# Patient Record
Sex: Female | Born: 1985 | Race: Black or African American | Hispanic: No | Marital: Married | Smoking: Never smoker
Health system: Southern US, Community
[De-identification: ages and names within clinical notes are randomized; demographics above are authoritative.]

## PROBLEM LIST (undated history)

## (undated) ENCOUNTER — Inpatient Hospital Stay (HOSPITAL_COMMUNITY): Payer: Self-pay

## (undated) DIAGNOSIS — N90810 Female genital mutilation status, unspecified: Secondary | ICD-10-CM

## (undated) DIAGNOSIS — O139 Gestational [pregnancy-induced] hypertension without significant proteinuria, unspecified trimester: Secondary | ICD-10-CM

## (undated) HISTORY — PX: CHOLECYSTECTOMY: SHX55

## (undated) HISTORY — DX: Female genital mutilation status, unspecified: N90.810

---

## 2014-01-11 NOTE — L&D Delivery Note (Signed)
Patient is 29 y.o. [id number] [redacted]w[redacted]d admitted  admitted for version which was successful. Had cervical ripening with cytotec and then was started on pitocin. AROM was performed using FSE for slow release of amniotic fluid.   Delivery Note At 3:55 PM a viable female was delivered via Vaginal, Spontaneous Delivery (Presentation: left; Occiput Anterior).  APGAR: 9, 9; weight 7 lb 10.9 oz (3485 g).  Placenta status: Intact, Spontaneous.  Cord: 3 vessels with the following complications: None.   Anesthesia: Epidural  Episiotomy: None Lacerations: 2nd degree Suture Repair: 3.0 vicryl Est. Blood Loss (mL): 130  Mom to postpartum.  Baby to Couplet care / Skin to Skin.  Durenda Hurt 10/11/2014, 6:56 PM

## 2014-02-13 ENCOUNTER — Encounter (HOSPITAL_COMMUNITY): Payer: Self-pay | Admitting: Emergency Medicine

## 2014-02-13 ENCOUNTER — Emergency Department (HOSPITAL_COMMUNITY)
Admission: EM | Admit: 2014-02-13 | Discharge: 2014-02-13 | Disposition: A | Discharge status: Home / Self Care | Payer: Medicaid Other | Source: Home / Self Care | Attending: Emergency Medicine | Admitting: Emergency Medicine

## 2014-02-13 DIAGNOSIS — R112 Nausea with vomiting, unspecified: Secondary | ICD-10-CM

## 2014-02-13 DIAGNOSIS — Z3490 Encounter for supervision of normal pregnancy, unspecified, unspecified trimester: Secondary | ICD-10-CM

## 2014-02-13 DIAGNOSIS — Z3201 Encounter for pregnancy test, result positive: Secondary | ICD-10-CM

## 2014-02-13 DIAGNOSIS — E860 Dehydration: Secondary | ICD-10-CM

## 2014-02-13 LAB — POCT I-STAT, CHEM 8
BUN: 11 mg/dL (ref 6–23)
CALCIUM ION: 1.12 mmol/L (ref 1.12–1.23)
CHLORIDE: 103 mmol/L (ref 96–112)
CREATININE: 0.6 mg/dL (ref 0.50–1.10)
Glucose, Bld: 108 mg/dL — ABNORMAL HIGH (ref 70–99)
HEMATOCRIT: 47 % — AB (ref 36.0–46.0)
Hemoglobin: 16 g/dL — ABNORMAL HIGH (ref 12.0–15.0)
Potassium: 3.8 mmol/L (ref 3.5–5.1)
SODIUM: 139 mmol/L (ref 135–145)
TCO2: 20 mmol/L (ref 0–100)

## 2014-02-13 LAB — POCT URINALYSIS DIP (DEVICE)
GLUCOSE, UA: NEGATIVE mg/dL
Hgb urine dipstick: NEGATIVE
Ketones, ur: 40 mg/dL — AB
NITRITE: NEGATIVE
PROTEIN: NEGATIVE mg/dL
Specific Gravity, Urine: 1.025 (ref 1.005–1.030)
Urobilinogen, UA: 4 mg/dL — ABNORMAL HIGH (ref 0.0–1.0)
pH: 6 (ref 5.0–8.0)

## 2014-02-13 LAB — POCT PREGNANCY, URINE: PREG TEST UR: POSITIVE — AB

## 2014-02-13 MED ORDER — ONDANSETRON HCL 4 MG PO TABS: 4.0000 mg | ORAL_TABLET | Freq: Three times a day (TID) | ORAL | Status: DC | PRN

## 2014-02-13 MED ORDER — ONDANSETRON HCL 4 MG/2ML IJ SOLN
4.0000 mg | Freq: Once | INTRAMUSCULAR | Status: AC
  Administered 2014-02-13: 4 mg via INTRAVENOUS

## 2014-02-13 MED ORDER — ONDANSETRON HCL 4 MG/2ML IJ SOLN
INTRAMUSCULAR | Status: AC
  Filled 2014-02-13: qty 2

## 2014-02-13 MED ORDER — PRENATAL VITAMINS 28-0.8 MG PO TABS: 1.0000 | ORAL_TABLET | Freq: Every day | ORAL | Status: DC

## 2014-02-13 MED ORDER — SODIUM CHLORIDE 0.9 % IV SOLN
Freq: Once | INTRAVENOUS | Status: AC
  Administered 2014-02-13: 15:00:00 via INTRAVENOUS

## 2014-02-13 NOTE — ED Provider Notes (Signed)
CSN: [id number]     Arrival date & time 02/13/14  1232 History   First MD Initiated Contact with Patient 02/13/14 1332     Chief Complaint  Patient presents with  . Emesis   (Consider location/radiation/quality/duration/timing/severity/associated sxs/prior Treatment) HPI Comments: LNMP: 12/26/2013  Patient is a 29 y.o. female presenting with vomiting. The history is provided by the patient and the spouse. The history is limited by a language barrier. No language interpreter was used.  Emesis Severity:  Moderate Duration:  3 days Quality:  Stomach contents Progression:  Unchanged Chronicity:  New Associated symptoms: no abdominal pain, no arthralgias, no chills, no cough, no diarrhea, no fever, no headaches, no myalgias, no sore throat and no URI   Risk factors: sick contacts     History reviewed. No pertinent past medical history. History reviewed. No pertinent past surgical history. No family history on file. History  Substance Use Topics  . Smoking status: Not on file  . Smokeless tobacco: Not on file  . Alcohol Use: Not on file   OB History    No data available     Review of Systems  Constitutional: Positive for appetite change. Negative for chills.  HENT: Negative.  Negative for sore throat.   Eyes: Negative.   Respiratory: Negative.   Cardiovascular: Negative.   Gastrointestinal: Positive for nausea and vomiting. Negative for abdominal pain, diarrhea, constipation and blood in stool.  Genitourinary: Negative.   Musculoskeletal: Negative for myalgias, back pain and arthralgias.  Skin: Negative.   Neurological: Positive for dizziness and light-headedness. Negative for syncope, weakness and headaches.    Allergies  Review of patient's allergies indicates no known allergies.  Home Medications   Prior to Admission medications   Medication Sig Start Date End Date Taking? Authorizing Provider  ondansetron (ZOFRAN) 4 MG tablet Take 1 tablet (4 mg total) by mouth  every 8 (eight) hours as needed for nausea or vomiting. 02/13/14   Ria ClockJennifer Lee H Presson, PA  Prenatal Vit-Fe Fumarate-FA (PRENATAL VITAMINS) 28-0.8 MG TABS Take 1 tablet by mouth daily. 02/13/14   Jess BartersJennifer Lee H Presson, PA   BP 124/90 mmHg  Pulse 98  Temp(Src) 98.4 F (36.9 C) (Oral)  Resp 16  SpO2 100%  LMP 12/26/2013 Physical Exam  Constitutional: She is oriented to person, place, and time. She appears well-developed and well-nourished. No distress.  HENT:  Head: Normocephalic and atraumatic.  Right Ear: External ear normal.  Left Ear: External ear normal.  Nose: Nose normal.  Mouth/Throat: Oropharynx is clear and moist.  Eyes: Conjunctivae are normal. No scleral icterus.  Neck: Trachea normal, normal range of motion and full passive range of motion without pain. Neck supple.  Cardiovascular: Normal rate, regular rhythm and normal heart sounds.   Pulmonary/Chest: Effort normal and breath sounds normal.  Abdominal: Soft. Normal appearance and bowel sounds are normal. She exhibits no distension and no mass. There is no tenderness. There is no CVA tenderness.  Musculoskeletal: Normal range of motion.  Lymphadenopathy:    She has no cervical adenopathy.  Neurological: She is alert and oriented to person, place, and time.  Skin: Skin is warm and dry.  Psychiatric: She has a normal mood and affect. Her behavior is normal.  Nursing note and vitals reviewed.   ED Course  Procedures (including critical care time) Labs Review Labs Reviewed  POCT PREGNANCY, URINE - Abnormal; Notable for the following:    Preg Test, Ur POSITIVE (*)    All other components within normal  limits  POCT I-STAT, CHEM 8 - Abnormal; Notable for the following:    Glucose, Bld 108 (*)    Hemoglobin 16.0 (*)    HCT 47.0 (*)    All other components within normal limits  POCT URINALYSIS DIP (DEVICE) - Abnormal; Notable for the following:    Bilirubin Urine SMALL (*)    Ketones, ur 40 (*)    Urobilinogen, UA  4.0 (*)    Leukocytes, UA TRACE (*)    All other components within normal limits    Imaging Review No results found.   MDM   1. Non-intractable vomiting with nausea, vomiting of unspecified type   2. Pregnant   3. Dehydration    Patient given 1L NS IV at [facility] with 4 mg IV zofran followed by oral fluid challenge.  UPT positive. Patient without any complaints of abdominal pain. While vomiting could certainly be attributed to first trimester pregnancy, it should be considered that patient's son is also currently ill with viral gastroenteritis. NO suspect travel or food intake.  UA consistent with dehydration. Patient ambulatory and tolerating oral fluids without continued dizziness following IV hydration. Will discharge home with Rx for prenatal vitamins, zofran, precautions and referral to [facility] for prenatal care. To see re-evaluation at ER of symptoms persist despite zofran.    Ria Clock, [place] 02/13/14 1630

## 2014-02-13 NOTE — ED Notes (Signed)
Via Husband, Arabic interpreter, pt C/o vomiting onset 3 days Sx also include: n/v, decreased appetite LMP = 12/26/2013 Arrived from [place] last month Alert, no signs of acute distress.

## 2014-02-13 NOTE — Discharge Instructions (Signed)
Dehydration, Adult °Dehydration is when you lose more fluids from the body than you take in. Vital organs like the kidneys, brain, and heart cannot function without a proper amount of fluids and salt. Any loss of fluids from the body can cause dehydration.  °CAUSES  °· Vomiting. °· Diarrhea. °· Excessive sweating. °· Excessive urine output. °· Fever. °SYMPTOMS  °Mild dehydration °· Thirst. °· Dry lips. °· Slightly dry mouth. °Moderate dehydration °· Very dry mouth. °· Sunken eyes. °· Skin does not bounce back quickly when lightly pinched and released. °· Dark urine and decreased urine production. °· Decreased tear production. °· Headache. °Severe dehydration °· Very dry mouth. °· Extreme thirst. °· Rapid, weak pulse (more than 100 beats per minute at rest). °· Cold hands and feet. °· Not able to sweat in spite of heat and temperature. °· Rapid breathing. °· Blue lips. °· Confusion and lethargy. °· Difficulty being awakened. °· Minimal urine production. °· No tears. °DIAGNOSIS  °Your caregiver will diagnose dehydration based on your symptoms and your exam. Blood and urine tests will help confirm the diagnosis. The diagnostic evaluation should also identify the cause of dehydration. °TREATMENT  °Treatment of mild or moderate dehydration can often be done at home by increasing the amount of fluids that you drink. It is best to drink small amounts of fluid more often. Drinking too much at one time can make vomiting worse. Refer to the home care instructions below. °Severe dehydration needs to be treated at the hospital where you will probably be given intravenous (IV) fluids that contain water and electrolytes. °HOME CARE INSTRUCTIONS  °· Ask your caregiver about specific rehydration instructions. °· Drink enough fluids to keep your urine clear or pale yellow. °· Drink small amounts frequently if you have nausea and vomiting. °· Eat as you normally do. °· Avoid: °¨ Foods or drinks high in sugar. °¨ Carbonated  drinks. °¨ Juice. °¨ Extremely hot or cold fluids. °¨ Drinks with caffeine. °¨ Fatty, greasy foods. °¨ Alcohol. °¨ Tobacco. °¨ Overeating. °¨ Gelatin desserts. °· Wash your hands well to avoid spreading bacteria and viruses. °· Only take over-the-counter or prescription medicines for pain, discomfort, or fever as directed by your caregiver. °· Ask your caregiver if you should continue all prescribed and over-the-counter medicines. °· Keep all follow-up appointments with your caregiver. °SEEK MEDICAL CARE IF: °· You have abdominal pain and it increases or stays in one area (localizes). °· You have a rash, stiff neck, or severe headache. °· You are irritable, sleepy, or difficult to awaken. °· You are weak, dizzy, or extremely thirsty. °SEEK IMMEDIATE MEDICAL CARE IF:  °· You are unable to keep fluids down or you get worse despite treatment. °· You have frequent episodes of vomiting or diarrhea. °· You have blood or green matter (bile) in your vomit. °· You have blood in your stool or your stool looks black and tarry. °· You have not urinated in 6 to 8 hours, or you have only urinated a small amount of very dark urine. °· You have a fever. °· You faint. °MAKE SURE YOU:  °· Understand these instructions. °· Will watch your condition. °· Will get help right away if you are not doing well or get worse. °Document Released: 12/28/2004 Document Revised: 03/22/2011 Document Reviewed: 08/17/2010 °Exit[employer]® Patient Information ©2015 [employer]. This information is not intended to replace advice given to you by your health care provider. Make sure you discuss any questions you have with your health care   provider.  Nausea and Vomiting Nausea is a sick feeling that often comes before throwing up (vomiting). Vomiting is a reflex where stomach contents come out of your mouth. Vomiting can cause severe loss of body fluids (dehydration). Children and elderly adults can become dehydrated quickly, especially if they also have  diarrhea. Nausea and vomiting are symptoms of a condition or disease. It is important to find the cause of your symptoms. CAUSES   Direct irritation of the stomach lining. This irritation can result from increased acid production (gastroesophageal reflux disease), infection, food poisoning, taking certain medicines (such as nonsteroidal anti-inflammatory drugs), alcohol use, or tobacco use.  Signals from the brain.These signals could be caused by a headache, heat exposure, an inner ear disturbance, increased pressure in the brain from injury, infection, a tumor, or a concussion, pain, emotional stimulus, or metabolic problems.  An obstruction in the gastrointestinal tract (bowel obstruction).  Illnesses such as diabetes, hepatitis, gallbladder problems, appendicitis, kidney problems, cancer, sepsis, atypical symptoms of a heart attack, or eating disorders.  Medical treatments such as chemotherapy and radiation.  Receiving medicine that makes you sleep (general anesthetic) during surgery. DIAGNOSIS Your caregiver may ask for tests to be done if the problems do not improve after a few days. Tests may also be done if symptoms are severe or if the reason for the nausea and vomiting is not clear. Tests may include:  Urine tests.  Blood tests.  Stool tests.  Cultures (to look for evidence of infection).  X-rays or other imaging studies. Test results can help your caregiver make decisions about treatment or the need for additional tests. TREATMENT You need to stay well hydrated. Drink frequently but in small amounts.You may wish to drink water, sports drinks, clear broth, or eat frozen ice pops or gelatin dessert to help stay hydrated.When you eat, eating slowly may help prevent nausea.There are also some antinausea medicines that may help prevent nausea. HOME CARE INSTRUCTIONS   Take all medicine as directed by your caregiver.  If you do not have an appetite, do not force yourself to  eat. However, you must continue to drink fluids.  If you have an appetite, eat a normal diet unless your caregiver tells you differently.  Eat a variety of complex carbohydrates (rice, wheat, potatoes, bread), lean meats, yogurt, fruits, and vegetables.  Avoid high-fat foods because they are more difficult to digest.  Drink enough water and fluids to keep your urine clear or pale yellow.  If you are dehydrated, ask your caregiver for specific rehydration instructions. Signs of dehydration may include:  Severe thirst.  Dry lips and mouth.  Dizziness.  Dark urine.  Decreasing urine frequency and amount.  Confusion.  Rapid breathing or pulse. SEEK IMMEDIATE MEDICAL CARE IF:   You have blood or brown flecks (like coffee grounds) in your vomit.  You have black or bloody stools.  You have a severe headache or stiff neck.  You are confused.  You have severe abdominal pain.  You have chest pain or trouble breathing.  You do not urinate at least once every 8 hours.  You develop cold or clammy skin.  You continue to vomit for longer than 24 to 48 hours.  You have a fever. MAKE SURE YOU:   Understand these instructions.  Will watch your condition.  Will get help right away if you are not doing well or get worse. Document Released: 12/28/2004 Document Revised: 03/22/2011 Document Reviewed: 05/27/2010 [facility] Patient Information 2015 [place], [place]. This information  is not intended to replace advice given to you by your health care provider. Make sure you discuss any questions you have with your health care provider.  Medicines During Pregnancy During pregnancy, there are medicines that are either safe or unsafe to take. Medicines include prescriptions from your caregiver, over-the-counter medicines, topical creams applied to the skin, and all herbal substances. Medicines are put into either Class A, B, C, or D. Class A and B medicines have been shown to be safe in  pregnancy. Class C medicines are also considered to be safe in pregnancy, but these medicines should only be used when necessary. Class D medicines should not be used at all in pregnancy. They can be harmful to a baby.  It is best to take as little medicine as possible while pregnant. However, some medicines are necessary to take for the mother and baby's health. Sometimes, it is more dangerous to stop taking certain medicines than to stay on them. This is often the case for people with long-term (chronic) conditions such as asthma, diabetes, or high blood pressure (hypertension). If you are pregnant and have a chronic illness, call your caregiver right away. Bring a list of your medicines and their doses to your appointments. If you are planning to become pregnant, schedule a doctor's appointment and discuss your medicines with your caregiver. Lastly, write down the phone number to your pharmacist. They can answer questions regarding a medicine's class and safety. They cannot give advice as to whether you should or should not be on a medicine.  SAFE AND UNSAFE MEDICINES There is a long list of medicines that are considered safe in pregnancy. Below is a shorter list. For specific medicines, ask your caregiver.  AllergyMedicines Loratadine, cetirizine, and chlorpheniramine are safe to take. Certain nasal steroid sprays are safe. Talk to your caregiver about specific brands that are safe. Analgesics Acetaminophen and acetaminophen with codeine are safe to take. All other nonsteroidal anti-inflammatory drugs (NSAIDS) are not safe. This includes ibuprofen.  Antacids Many over-the-counter antacids are safe to take. Talk to your caregiver about specific brands that are safe. Famotidine, ranitidine, and lansoprazole are safe. Omepresole is considered safe to take in the second trimester. Antibiotic Medicines There are several antibiotics to avoid. These include, but are not limited to, tetracyline,  quinolones, and sulfa medications. Talk to your caregiver before taking any antibiotic.  Antihistamines Talk to your caregiver about specific brands that are safe.  Asthma Medicines Most asthma steroid inhalers are safe to take. Talk to your caregiver for specific details. Calcium Calcium supplements are safe to take. Do not take oyster shell calcium.  Cough and Cold Medicines It is safe to take products with guaifenesin or dextromethorphan. Talk to your caregiver about specific brands that are safe. It is not safe to take products that contain aspirin or ibuprofen. Decongestant Medicines Pseudoephedrine-containing products are safe to take in the second and third trimester.  Depression Medicines Talk about these medicines with your caregiver.  Antidiarrheal Medicines It is safe to take loperamide. Talk to your caregiver about specific brands that are safe. It is not safe to take any antidiarrheal medicine that contains bismuth. Eyedrops Allergy eyedrops should be limited.  Iron It is safe to use certain iron-containing medicines for anemia in pregnancy. They require a prescription.  Antinausea Medicines It is safe to take doxylamine and vitamin B6 as directed. There are other prescription medicines available, if needed.  Sleep aids It is safe to take diphenhydramine and acetaminophen with diphenhydramine.  Steroids Hydrocortisone creams are safe to use as directed. Oral steroids require a prescription. It is not safe to take any hemorrhoid cream with pramoxine or phenylephrine. Stool softener It is safe to take stool softener medicines. Avoid daily or prolonged use of stool softeners. Thyroid Medicine It is important to stay on this thyroid medicine. It needs to be followed by your caregiver.  Vaginal Medicines Your caregiver will prescribe a medicine to you if you have a vaginal infection. Certain antifungal medicines are safe to use if you have a sexually transmitted  infection (STI). Talk to your caregiver.  Document Released: 12/28/2004 Document Revised: 03/22/2011 Document Reviewed: 12/29/2010 [facility] Patient Information 2015 [place], [place]. This information is not intended to replace advice given to you by your health care provider. Make sure you discuss any questions you have with your health care provider.  Prenatal Care  WHAT IS PRENATAL CARE?  Prenatal care means health care during your pregnancy, before your baby is born. It is very important to take care of yourself and your baby during your pregnancy by:   Getting early prenatal care. If you know you are pregnant, or think you might be pregnant, call your health care provider as soon as possible. Schedule a visit for a prenatal exam.  Getting regular prenatal care. Follow your health care provider's schedule for blood and other necessary tests. Do not miss appointments.  Doing everything you can to keep yourself and your baby healthy during your pregnancy.  Getting complete care. Prenatal care should include evaluation of the medical, dietary, educational, psychological, and social needs of you and your significant other. The medical and genetic history of your family and the family of your baby's father should be discussed with your health care provider.  Discussing with your health care provider:  Prescription, over-the-counter, and herbal medicines that you take.  Any history of substance abuse, alcohol use, smoking, and illegal drug use.  Any history of domestic abuse and violence.  Immunizations you have received.  Your nutrition and diet.  The amount of exercise you do.  Any environmental and occupational hazards to which you are exposed.  History of sexually transmitted infections for both you and your partner.  Previous pregnancies you have had. WHY IS PRENATAL CARE SO IMPORTANT?  By regularly seeing your health care provider, you help ensure that problems can be identified  early so that they can be treated as soon as possible. Other problems might be prevented. Many studies have shown that early and regular prenatal care is important for the health of mothers and their babies.  HOW CAN I TAKE CARE OF MYSELF WHILE I AM PREGNANT?  Here are ways to take care of yourself and your baby:   Start or continue taking your multivitamin with 400 micrograms (mcg) of folic acid every day.  Get early and regular prenatal care. It is very important to see a health care provider during your pregnancy. Your health care provider will check at each visit to make sure that you and your baby are healthy. If there are any problems, action can be taken right away to help you and your baby.  Eat a healthy diet that includes:  Fruits.  Vegetables.  Foods low in saturated fat.  Whole grains.  Calcium-rich foods, such as milk, yogurt, and hard cheeses.  Drink 6-8 glasses of liquids a day.  Unless your health care provider tells you not to, try to be physically active for 30 minutes, most days of the  week. If you are pressed for time, you can get your activity in through 10-minute segments, three times a day.  Do not smoke, drink alcohol, or use drugs. These can cause long-term damage to your baby. Talk with your health care provider about steps to take to stop smoking. Talk with a member of your faith community, a counselor, a trusted friend, or your health care provider if you are concerned about your alcohol or drug use.  Ask your health care provider before taking any medicine, even over-the-counter medicines. Some medicines are not safe to take during pregnancy.  Get plenty of rest and sleep.  Avoid hot tubs and saunas during pregnancy.  Do not have X-rays taken unless absolutely necessary and with the recommendation of your health care provider. A lead shield can be placed on your abdomen to protect your baby when X-rays are taken in other parts of your body.  Do not empty  the cat litter when you are pregnant. It may contain a parasite that causes an infection called toxoplasmosis, which can cause birth defects. Also, use gloves when working in garden areas used by cats.  Do not eat uncooked or undercooked meats or fish.  Do not eat soft, mold-ripened cheeses (Brie, Camembert, and chevre) or soft, blue-veined cheese (Danish blue and Roquefort).  Stay away from toxic chemicals like:  Insecticides.  Solvents (some cleaners or paint thinners).  Lead.  Mercury.  Sexual intercourse may continue until the end of the pregnancy, unless you have a medical problem or there is a problem with the pregnancy and your health care provider tells you not to.  Do not wear high-heel shoes, especially during the second half of the pregnancy. You can lose your balance and fall.  Do not take long trips, unless absolutely necessary. Be sure to see your health care provider before going on the trip.  Do not sit in one position for more than 2 hours when on a trip.  Take a copy of your medical records when going on a trip. Know where a hospital is located in the city you are visiting, in case of an emergency.  Most dangerous household products will have pregnancy warnings on their labels. Ask your health care provider about products if you are unsure.  Limit or eliminate your caffeine intake from coffee, tea, sodas, medicines, and chocolate.  Many women continue working through pregnancy. Staying active might help you stay healthier. If you have a question about the safety or the hours you work at your particular job, talk with your health care provider.  Get informed:  Read books.  Watch videos.  Go to childbirth classes for you and your significant other.  Talk with experienced moms.  Ask your health care provider about childbirth education classes for you and your partner. Classes can help you and your partner prepare for the birth of your baby.  Ask about a baby  doctor (pediatrician) and methods and pain medicine for labor, delivery, and possible cesarean delivery. HOW OFTEN SHOULD I SEE MY HEALTH CARE PROVIDER DURING PREGNANCY?  Your health care provider will give you a schedule for your prenatal visits. You will have visits more often as you get closer to the end of your pregnancy. An average pregnancy lasts about 40 weeks.  A typical schedule includes visiting your health care provider:   About once each month during your first 6 months of pregnancy.  Every 2 weeks during the next 2 months.  Weekly in the last  month, until the delivery date. Your health care provider will probably want to see you more often if:  You are older than 35 years.  Your pregnancy is high risk because you have certain health problems or problems with the pregnancy, such as:  Diabetes.  High blood pressure.  The baby is not growing on schedule, according to the dates of the pregnancy. Your health care provider will do special tests to make sure you and your baby are not having any serious problems. WHAT HAPPENS DURING PRENATAL VISITS?   At your first prenatal visit, your health care provider will do a physical exam and talk to you about your health history and the health history of your partner and your family. Your health care provider will be able to tell you what date to expect your baby to be born on.  Your first physical exam will include checks of your blood pressure, measurements of your height and weight, and an exam of your pelvic organs. Your health care provider will do a Pap test if you have not had one recently and will do cultures of your cervix to make sure there is no infection.  At each prenatal visit, there will be tests of your blood, urine, blood pressure, weight, and the progress of the baby will be checked.  At your later prenatal visits, your health care provider will check how you are doing and how your baby is developing. You may have a  number of tests done as your pregnancy progresses.  Ultrasound exams are often used to check on your baby's growth and health.  You may have more urine and blood tests, as well as special tests, if needed. These may include amniocentesis to examine fluid in the pregnancy sac, stress tests to check how the baby responds to contractions, or a biophysical profile to measure your baby's well-being. Your health care provider will explain the tests and why they are necessary.  You should be tested for high blood sugar (gestational diabetes) between the 24th and 28th weeks of your pregnancy.  You should discuss with your health care provider your plans to breastfeed or bottle-feed your baby.  Each visit is also a chance for you to learn about staying healthy during pregnancy and to ask questions. Document Released: 12/31/2002 Document Revised: 01/02/2013 Document Reviewed: 03/14/2013 [facility] Patient Information 2015 [place], [place]. This information is not intended to replace advice given to you by your health care provider. Make sure you discuss any questions you have with your health care provider.

## 2014-03-14 ENCOUNTER — Encounter: Payer: Self-pay | Admitting: Family Medicine

## 2014-03-14 ENCOUNTER — Ambulatory Visit (INDEPENDENT_AMBULATORY_CARE_PROVIDER_SITE_OTHER): Payer: Medicaid Other | Admitting: Family Medicine

## 2014-03-14 ENCOUNTER — Other Ambulatory Visit (HOSPITAL_COMMUNITY)
Admission: RE | Admit: 2014-03-14 | Discharge: 2014-03-14 | Disposition: A | Discharge status: Home / Self Care | Payer: Medicaid Other | Source: Ambulatory Visit | Attending: Family Medicine | Admitting: Family Medicine

## 2014-03-14 VITALS — BP 128/71 | HR 82 | Temp 98.5°F | Ht 67.0 in | Wt 277.2 lb

## 2014-03-14 DIAGNOSIS — Z113 Encounter for screening for infections with a predominantly sexual mode of transmission: Secondary | ICD-10-CM | POA: Diagnosis present

## 2014-03-14 DIAGNOSIS — Z3490 Encounter for supervision of normal pregnancy, unspecified, unspecified trimester: Secondary | ICD-10-CM | POA: Insufficient documentation

## 2014-03-14 DIAGNOSIS — Z01419 Encounter for gynecological examination (general) (routine) without abnormal findings: Secondary | ICD-10-CM | POA: Diagnosis not present

## 2014-03-14 DIAGNOSIS — Z3481 Encounter for supervision of other normal pregnancy, first trimester: Secondary | ICD-10-CM

## 2014-03-14 DIAGNOSIS — O99210 Obesity complicating pregnancy, unspecified trimester: Secondary | ICD-10-CM

## 2014-03-14 LAB — POCT URINALYSIS DIP (DEVICE)
BILIRUBIN URINE: NEGATIVE
GLUCOSE, UA: NEGATIVE mg/dL
Hgb urine dipstick: NEGATIVE
KETONES UR: NEGATIVE mg/dL
Leukocytes, UA: NEGATIVE
Nitrite: NEGATIVE
PH: 6.5 (ref 5.0–8.0)
Protein, ur: NEGATIVE mg/dL
Specific Gravity, Urine: 1.015 (ref 1.005–1.030)
Urobilinogen, UA: 0.2 mg/dL (ref 0.0–1.0)

## 2014-03-14 MED ORDER — FAMOTIDINE 40 MG PO TABS: 40.0000 mg | ORAL_TABLET | Freq: Every day | ORAL | Status: DC

## 2014-03-14 NOTE — Progress Notes (Signed)
[employer] # [id number] Weight gain 11-20lbs New ob packet given Early glucola given due to BMI>30 due @ 1017 Edema- face at times    Pain- back, cramping, pain right side leg

## 2014-03-14 NOTE — Patient Instructions (Signed)
Second Trimester of Pregnancy The second trimester is from week 13 through week 28, month 4 through 6. This is often the time in pregnancy that you feel your best. Often times, morning sickness has lessened or quit. You may have more energy, and you may get hungry more often. Your unborn baby (fetus) is growing rapidly. At the end of the sixth month, he or she is about 9 inches long and weighs about 1 pounds. You will likely feel the baby move (quickening) between 18 and 20 weeks of pregnancy. HOME CARE   Avoid all smoking, herbs, and alcohol. Avoid drugs not approved by your doctor.  Only take medicine as told by your doctor. Some medicines are safe and some are not during pregnancy.  Exercise only as told by your doctor. Stop exercising if you start having cramps.  Eat regular, healthy meals.  Wear a good support bra if your breasts are tender.  Do not use hot tubs, steam rooms, or saunas.  Wear your seat belt when driving.  Avoid raw meat, uncooked cheese, and liter boxes and soil used by cats.  Take your prenatal vitamins.  Try taking medicine that helps you poop (stool softener) as needed, and if your doctor approves. Eat more fiber by eating fresh fruit, vegetables, and whole grains. Drink enough fluids to keep your pee (urine) clear or pale yellow.  Take warm water baths (sitz baths) to soothe pain or discomfort caused by hemorrhoids. Use hemorrhoid cream if your doctor approves.  If you have puffy, bulging veins (varicose veins), wear support hose. Raise (elevate) your feet for 15 minutes, 3-4 times a day. Limit salt in your diet.  Avoid heavy lifting, wear low heals, and sit up straight.  Rest with your legs raised if you have leg cramps or low back pain.  Visit your dentist if you have not gone during your pregnancy. Use a soft toothbrush to brush your teeth. Be gentle when you floss.  You can have sex (intercourse) unless your doctor tells you not to.  Go to your  doctor visits. GET HELP IF:   You feel dizzy.  You have mild cramps or pressure in your lower belly (abdomen).  You have a nagging pain in your belly area.  You continue to feel sick to your stomach (nauseous), throw up (vomit), or have watery poop (diarrhea).  You have bad smelling fluid coming from your vagina.  You have pain with peeing (urination). GET HELP RIGHT AWAY IF:   You have a fever.  You are leaking fluid from your vagina.  You have spotting or bleeding from your vagina.  You have severe belly cramping or pain.  You lose or gain weight rapidly.  You have trouble catching your breath and have chest pain.  You notice sudden or extreme puffiness (swelling) of your face, hands, ankles, feet, or legs.  You have not felt the baby move in over an hour.  You have severe headaches that do not go away with medicine.  You have vision changes. Document Released: 03/24/2009 Document Revised: 04/24/2012 Document Reviewed: 02/29/2012 [employer] Patient Information 2015 [employer]. This information is not intended to replace advice given to you by your health care provider. Make sure you discuss any questions you have with your health care provider.  

## 2014-03-14 NOTE — Progress Notes (Signed)
   Subjective:    Kelli Russell is a [id number] 6367w1d being seen today for her first obstetrical visit.  Her obstetrical history is significant for obesity. Patient does intend to breast feed. Pregnancy history fully reviewed.  Patient reports no bleeding, no contractions, no cramping and no leaking.  Filed Vitals:   03/14/14 0858 03/14/14 0911  BP: 128/71   Pulse: 82   Temp: 98.5 F (36.9 C)   Height:  5\' 7"  (1.702 m)  Weight: 277 lb 3.2 oz (125.737 kg)     HISTORY: OB History  Gravida Para Term Preterm AB SAB TAB Ectopic Multiple Living  4 1 1  2 2    2     # Outcome Date GA Lbr Len/2nd Weight Sex Delivery Anes PTL Lv  4 Current           3 Term 12/05/09 4122w0d  6 lb 9.8 oz (3 kg) M Vag-Spont EPI N Y  2 SAB           1 SAB              Past Medical History  Diagnosis Date  . Kidney stones   . Female circumcision    History reviewed. No pertinent past surgical history. History reviewed. No pertinent family history.   Exam    Uterus:     Pelvic Exam:    Perineum: No Hemorrhoids, Normal Perineum   Vulva: normal, Bartholin's, Urethra, Skene's normal   Vagina:  normal mucosa, normal discharge   Cervix: multiparous appearance and no bleeding following Pap   Adnexa: normal adnexa and no mass, fullness, tenderness   Bony Pelvis: gynecoid  System:     Skin: normal coloration and turgor, no rashes    Neurologic: oriented, normal, gait normal; reflexes normal and symmetric   Extremities: normal strength, tone, and muscle mass, no deformities   HEENT PERRLA and extra ocular movement intact   Mouth/Teeth mucous membranes moist, pharynx normal without lesions   Neck supple and no masses   Cardiovascular: regular rate and rhythm, no murmurs or gallops   Respiratory:  appears well, vitals normal, no respiratory distress, acyanotic, normal RR, ear and throat exam is normal, neck free of mass or lymphadenopathy, chest clear, no wheezing, crepitations, rhonchi, normal  symmetric air entry   Abdomen: soft, non-tender; bowel sounds normal; no masses,  no organomegaly   Urinary: urethral meatus normal      Assessment:    Pregnancy: [id number] Patient Active Problem List   Diagnosis Date Noted  . Encounter for supervision of other normal pregnancy in first trimester 03/14/2014  . Obesity in pregnancy 03/14/2014        Plan:     Initial labs drawn.  PAP done.  Early 1hr GTT due to obesity. Prenatal vitamins. Problem list reviewed and updated. Genetic Screening discussed Quad Screen: requested.  Ultrasound discussed; fetal survey: requested.  Follow up in 4 weeks. 100% of 30 min visit spent on counseling and coordination of care.     Candelaria CelesteSTINSON, JACOB JEHIEL 03/14/2014

## 2014-03-15 LAB — OBSTETRIC PANEL
ANTIBODY SCREEN: NEGATIVE
BASOS PCT: 0 % (ref 0–1)
Basophils Absolute: 0 10*3/uL (ref 0.0–0.1)
EOS PCT: 1 % (ref 0–5)
Eosinophils Absolute: 0.1 10*3/uL (ref 0.0–0.7)
HEMATOCRIT: 40.3 % (ref 36.0–46.0)
HEMOGLOBIN: 13.2 g/dL (ref 12.0–15.0)
HEP B S AG: NEGATIVE
LYMPHS ABS: 1.6 10*3/uL (ref 0.7–4.0)
Lymphocytes Relative: 29 % (ref 12–46)
MCH: 29 pg (ref 26.0–34.0)
MCHC: 32.8 g/dL (ref 30.0–36.0)
MCV: 88.6 fL (ref 78.0–100.0)
MONO ABS: 0.6 10*3/uL (ref 0.1–1.0)
MONOS PCT: 11 % (ref 3–12)
MPV: 11.3 fL (ref 8.6–12.4)
Neutro Abs: 3.3 10*3/uL (ref 1.7–7.7)
Neutrophils Relative %: 59 % (ref 43–77)
Platelets: 402 10*3/uL — ABNORMAL HIGH (ref 150–400)
RBC: 4.55 MIL/uL (ref 3.87–5.11)
RDW: 14 % (ref 11.5–15.5)
RH TYPE: POSITIVE
Rubella: 3.21 Index — ABNORMAL HIGH (ref <0.90)
WBC: 5.6 10*3/uL (ref 4.0–10.5)

## 2014-03-15 LAB — GLUCOSE TOLERANCE, 1 HOUR (50G) W/O FASTING: GLUCOSE 1 HOUR GTT: 98 mg/dL (ref 70–140)

## 2014-03-16 LAB — CULTURE, OB URINE
COLONY COUNT: NO GROWTH
Organism ID, Bacteria: NO GROWTH

## 2014-03-18 LAB — HEMOGLOBINOPATHY EVALUATION
Hemoglobin Other: 0 %
Hgb A2 Quant: 2.9 % (ref 2.2–3.2)
Hgb A: 97.1 % (ref 96.8–97.8)
Hgb F Quant: 0 % (ref 0.0–2.0)
Hgb S Quant: 0 %

## 2014-03-18 LAB — CYTOLOGY - PAP

## 2014-04-11 ENCOUNTER — Ambulatory Visit (INDEPENDENT_AMBULATORY_CARE_PROVIDER_SITE_OTHER): Payer: Medicaid Other | Admitting: Obstetrics and Gynecology

## 2014-04-11 VITALS — BP 126/65 | HR 92 | Temp 98.4°F | Wt 277.3 lb

## 2014-04-11 DIAGNOSIS — Z3482 Encounter for supervision of other normal pregnancy, second trimester: Secondary | ICD-10-CM

## 2014-04-11 LAB — POCT URINALYSIS DIP (DEVICE)
BILIRUBIN URINE: NEGATIVE
Glucose, UA: NEGATIVE mg/dL
HGB URINE DIPSTICK: NEGATIVE
Ketones, ur: NEGATIVE mg/dL
Nitrite: NEGATIVE
PROTEIN: NEGATIVE mg/dL
Specific Gravity, Urine: 1.02 (ref 1.005–1.030)
UROBILINOGEN UA: 0.2 mg/dL (ref 0.0–1.0)
pH: 5.5 (ref 5.0–8.0)

## 2014-04-11 NOTE — Progress Notes (Signed)
[employer] interpreter used. [place], in [place] 3 months. P1 was uncomplicated NSVD in [place].  Doing well. Discussed heartburn> try Tums.  Quad screen today. NOB labs reviewed> nl. Early GCT nl. Anatomic US scheduled and see nutritionist.

## 2014-04-11 NOTE — Patient Instructions (Addendum)
Second Trimester of Pregnancy The second trimester is from week 13 through week 28, months 4 through 6. The second trimester is often a time when you feel your best. Your body has also adjusted to being pregnant, and you begin to feel better physically. Usually, morning sickness has lessened or quit completely, you may have more energy, and you may have an increase in appetite. The second trimester is also a time when the fetus is growing rapidly. At the end of the sixth month, the fetus is about 9 inches long and weighs about 1 pounds. You will likely begin to feel the baby move (quickening) between 18 and 20 weeks of the pregnancy. BODY CHANGES Your body goes through many changes during pregnancy. The changes vary from woman to woman.   Your weight will continue to increase. You will notice your lower abdomen bulging out.  You may begin to get stretch marks on your hips, abdomen, and breasts.  You may develop headaches that can be relieved by medicines approved by your health care provider.  You may urinate more often because the fetus is pressing on your bladder.  You may develop or continue to have heartburn as a result of your pregnancy.  You may develop constipation because certain hormones are causing the muscles that push waste through your intestines to slow down.  You may develop hemorrhoids or swollen, bulging veins (varicose veins).  You may have back pain because of the weight gain and pregnancy hormones relaxing your joints between the bones in your pelvis and as a result of a shift in weight and the muscles that support your balance.  Your breasts will continue to grow and be tender.  Your gums may bleed and may be sensitive to brushing and flossing.  Dark spots or blotches (chloasma, mask of pregnancy) may develop on your face. This will likely fade after the baby is born.  A dark line from your belly button to the pubic area (linea nigra) may appear. This will likely fade  after the baby is born.  You may have changes in your hair. These can include thickening of your hair, rapid growth, and changes in texture. Some women also have hair loss during or after pregnancy, or hair that feels dry or thin. Your hair will most likely return to normal after your baby is born. WHAT TO EXPECT AT YOUR PRENATAL VISITS During a routine prenatal visit:  You will be weighed to make sure you and the fetus are growing normally.  Your blood pressure will be taken.  Your abdomen will be measured to track your baby's growth.  The fetal heartbeat will be listened to.  Any test results from the previous visit will be discussed. Your health care provider may ask you:  How you are feeling.  If you are feeling the baby move.  If you have had any abnormal symptoms, such as leaking fluid, bleeding, severe headaches, or abdominal cramping.  If you have any questions. Other tests that may be performed during your second trimester include:  Blood tests that check for:  Low iron levels (anemia).  Gestational diabetes (between 24 and 28 weeks).  Rh antibodies.  Urine tests to check for infections, diabetes, or protein in the urine.  An ultrasound to confirm the proper growth and development of the baby.  An amniocentesis to check for possible genetic problems.  Fetal screens for spina bifida and Down syndrome. HOME CARE INSTRUCTIONS   Avoid all smoking, herbs, alcohol, and unprescribed   drugs. These chemicals affect the formation and growth of the baby.  Follow your health care provider's instructions regarding medicine use. There are medicines that are either safe or unsafe to take during pregnancy.  Exercise only as directed by your health care provider. Experiencing uterine cramps is a good sign to stop exercising.  Continue to eat regular, healthy meals.  Wear a good support bra for breast tenderness.  Do not use hot tubs, steam rooms, or saunas.  Wear your  seat belt at all times when driving.  Avoid raw meat, uncooked cheese, cat litter boxes, and soil used by cats. These carry germs that can cause birth defects in the baby.  Take your prenatal vitamins.  Try taking a stool softener (if your health care provider approves) if you develop constipation. Eat more high-fiber foods, such as fresh vegetables or fruit and whole grains. Drink plenty of fluids to keep your urine clear or pale yellow.  Take warm sitz baths to soothe any pain or discomfort caused by hemorrhoids. Use hemorrhoid cream if your health care provider approves.  If you develop varicose veins, wear support hose. Elevate your feet for 15 minutes, 3-4 times a day. Limit salt in your diet.  Avoid heavy lifting, wear low heel shoes, and practice good posture.  Rest with your legs elevated if you have leg cramps or low back pain.  Visit your dentist if you have not gone yet during your pregnancy. Use a soft toothbrush to brush your teeth and be gentle when you floss.  A sexual relationship may be continued unless your health care provider directs you otherwise.  Continue to go to all your prenatal visits as directed by your health care provider. SEEK MEDICAL CARE IF:   You have dizziness.  You have mild pelvic cramps, pelvic pressure, or nagging pain in the abdominal area.  You have persistent nausea, vomiting, or diarrhea.  You have a bad smelling vaginal discharge.  You have pain with urination. SEEK IMMEDIATE MEDICAL CARE IF:   You have a fever.  You are leaking fluid from your vagina.  You have spotting or bleeding from your vagina.  You have severe abdominal cramping or pain.  You have rapid weight gain or loss.  You have shortness of breath with chest pain.  You notice sudden or extreme swelling of your face, hands, ankles, feet, or legs.  You have not felt your baby move in over an hour.  You have severe headaches that do not go away with  medicine.  You have vision changes. Document Released: 12/22/2000 Document Revised: 01/02/2013 Document Reviewed: 02/29/2012 [facility] Patient Information 2015 [employer], [place]. This information is not intended to replace advice given to you by your health care provider. Make sure you discuss any questions you have with your health care provider. Heartburn Heartburn is a painful, burning sensation in the chest. It may feel worse in certain positions, such as lying down or bending over. It is caused by stomach acid backing up into the tube that carries food from the mouth down to the stomach (lower esophagus).  CAUSES   Large meals.  Certain foods and drinks.  Exercise.  Increased acid production.  Being overweight or obese.  Certain medicines. SYMPTOMS   Burning pain in the chest or lower throat.  Bitter taste in the mouth.  Coughing. DIAGNOSIS  If the usual treatments for heartburn do not improve your symptoms, then tests may be done to see if there is another condition present. Possible  tests may include:  X-rays.  Endoscopy. This is when a tube with a light and a camera on the end is used to examine the esophagus and the stomach.  A test to measure the amount of acid in the esophagus (pH test).  A test to see if the esophagus is working properly (esophageal manometry).  Blood, breath, or stool tests to check for bacteria that cause ulcers. TREATMENT   Your caregiver may tell you to use certain over-the-counter medicines (antacids, acid reducers) for mild heartburn.  Your caregiver may prescribe medicines to decrease the acid in your stomach or protect your stomach lining.  Your caregiver may recommend certain diet changes.  For severe cases, your caregiver may recommend that the head of your bed be elevated on blocks. (Sleeping with more pillows is not an effective treatment as it only changes the position of your head and does not improve the main problem of stomach  acid refluxing into the esophagus.) HOME CARE INSTRUCTIONS   Take all medicines as directed by your caregiver.  Raise the head of your bed by putting blocks under the legs if instructed to by your caregiver.  Do not exercise right after eating.  Avoid eating 2 or 3 hours before bed. Do not lie down right after eating.  Eat small meals throughout the day instead of 3 large meals.  Stop smoking if you smoke.  Maintain a healthy weight.  Identify foods and beverages that make your symptoms worse and avoid them. Foods you may want to avoid include:  Peppers.  Chocolate.  High-fat foods, including fried foods.  Spicy foods.  Garlic and onions.  Citrus fruits, including oranges, grapefruit, lemons, and limes.  Food containing tomatoes or tomato products.  Mint.  Carbonated drinks, caffeinated drinks, and alcohol.  Vinegar. SEEK IMMEDIATE MEDICAL CARE IF:  You have severe chest pain that goes down your arm or into your jaw or neck.  You feel sweaty, dizzy, or lightheaded.  You are short of breath.  You vomit blood.  You have difficulty or pain with swallowing.  You have bloody or black, tarry stools.  You have episodes of heartburn more than 3 times a week for more than 2 weeks. MAKE SURE YOU:  Understand these instructions.  Will watch your condition.  Will get help right away if you are not doing well or get worse. Document Released: 05/16/2008 Document Revised: 03/22/2011 Document Reviewed: 06/14/2010 [facility] CenterExitCare Patient Information 2015 [employer] College[employer]Care, Maryland[employer]. This information is not intended to replace advice given to you by your health care provider. Make sure you discuss any questions you have with your health care provider.

## 2014-04-11 NOTE — Progress Notes (Signed)
Small leuks in urine.  

## 2014-04-11 NOTE — Progress Notes (Signed)
Nutrition note: 1st nutr visit Pt has h/o obesity. Pt has gained 7.3# @ 1590w1d, which is > expected. Pt reports eating 3 meals & 1 snack/d. Pt stopped taking PNV due to she stated it makes her not sleep at night. Pt received verbal & written education via language line about general nutrition during pregnancy. Encouraged pt to try 2 chewable multivitamins instead of the ones that she said make her not sleep well. Discussed wt gain goals of 11-20# or 0.5#/wk. Pt agrees to start taking PNV or equivalent & decrease sweets intake. Pt does not have WIC but plans to apply. Pt plans to BF. F/u as needed Blondell RevealLaura Kaelyn Nauta, MS, RD, LDN, [facility] CampusBCLC

## 2014-04-11 NOTE — Progress Notes (Signed)
Anatomy U/S 05/09/14 @ 930a.  [place] phone interpreter used.

## 2014-04-11 NOTE — Progress Notes (Signed)
[employer] interpreter # [phone]/ Pain- epigastric area "like something is grabbing"

## 2014-04-16 LAB — AFP, QUAD SCREEN
AFP: 15.7 ng/mL
Age Alone: 1:844 {titer}
CURR GEST AGE: 15.1 wks.days
Down Syndrome Scr Risk Est: 1:874 {titer}
HCG, Total: 36.38 IU/mL
INH: 152.7 pg/mL
Interpretation-AFP: NEGATIVE
MOM FOR HCG: 1.04
MoM for AFP: 0.73
MoM for INH: 1.16
Open Spina bifida: NEGATIVE
Tri 18 Scr Risk Est: NEGATIVE
Trisomy 18 (Edward) Syndrome Interp.: 1:1210 {titer}
UE3 MOM: 0.45
uE3 Value: 0.25 ng/mL

## 2014-05-07 ENCOUNTER — Other Ambulatory Visit: Payer: Self-pay | Admitting: Infectious Disease

## 2014-05-07 ENCOUNTER — Ambulatory Visit
Admission: RE | Admit: 2014-05-07 | Discharge: 2014-05-07 | Disposition: A | Discharge status: Home / Self Care | Payer: No Typology Code available for payment source | Source: Ambulatory Visit | Attending: Infectious Disease | Admitting: Infectious Disease

## 2014-05-07 DIAGNOSIS — R7611 Nonspecific reaction to tuberculin skin test without active tuberculosis: Secondary | ICD-10-CM

## 2014-05-08 ENCOUNTER — Encounter: Payer: Self-pay | Admitting: Family Medicine

## 2014-05-08 ENCOUNTER — Encounter (HOSPITAL_COMMUNITY): Payer: Self-pay

## 2014-05-09 ENCOUNTER — Ambulatory Visit (HOSPITAL_COMMUNITY)
Admission: RE | Admit: 2014-05-09 | Discharge: 2014-05-09 | Disposition: A | Discharge status: Home / Self Care | Payer: Medicaid Other | Source: Ambulatory Visit | Attending: Obstetrics and Gynecology | Admitting: Obstetrics and Gynecology

## 2014-05-09 ENCOUNTER — Ambulatory Visit (INDEPENDENT_AMBULATORY_CARE_PROVIDER_SITE_OTHER): Payer: Medicaid Other | Admitting: Family Medicine

## 2014-05-09 VITALS — BP 120/82 | HR 89 | Temp 98.1°F | Wt 280.1 lb

## 2014-05-09 DIAGNOSIS — M5430 Sciatica, unspecified side: Secondary | ICD-10-CM | POA: Insufficient documentation

## 2014-05-09 DIAGNOSIS — Z3A19 19 weeks gestation of pregnancy: Secondary | ICD-10-CM | POA: Diagnosis not present

## 2014-05-09 DIAGNOSIS — Z3482 Encounter for supervision of other normal pregnancy, second trimester: Secondary | ICD-10-CM

## 2014-05-09 DIAGNOSIS — Z3A17 17 weeks gestation of pregnancy: Secondary | ICD-10-CM | POA: Insufficient documentation

## 2014-05-09 DIAGNOSIS — O99212 Obesity complicating pregnancy, second trimester: Secondary | ICD-10-CM | POA: Diagnosis not present

## 2014-05-09 DIAGNOSIS — Z3689 Encounter for other specified antenatal screening: Secondary | ICD-10-CM | POA: Insufficient documentation

## 2014-05-09 DIAGNOSIS — Z3481 Encounter for supervision of other normal pregnancy, first trimester: Secondary | ICD-10-CM

## 2014-05-09 DIAGNOSIS — Z36 Encounter for antenatal screening of mother: Secondary | ICD-10-CM | POA: Insufficient documentation

## 2014-05-09 DIAGNOSIS — O99210 Obesity complicating pregnancy, unspecified trimester: Secondary | ICD-10-CM

## 2014-05-09 LAB — POCT URINALYSIS DIP (DEVICE)
Glucose, UA: NEGATIVE mg/dL
HGB URINE DIPSTICK: NEGATIVE
Nitrite: NEGATIVE
PH: 5.5 (ref 5.0–8.0)
PROTEIN: 30 mg/dL — AB
SPECIFIC GRAVITY, URINE: 1.025 (ref 1.005–1.030)
Urobilinogen, UA: 0.2 mg/dL (ref 0.0–1.0)

## 2014-05-09 MED ORDER — CYCLOBENZAPRINE HCL 5 MG PO TABS: 5.0000 mg | ORAL_TABLET | Freq: Three times a day (TID) | ORAL | Status: DC | PRN

## 2014-05-09 NOTE — Patient Instructions (Signed)
Sciatica with Rehab The sciatic nerve runs from the back down the leg and is responsible for sensation and control of the muscles in the back (posterior) side of the thigh, lower leg, and foot. Sciatica is a condition that is characterized by inflammation of this nerve.  SYMPTOMS   Signs of nerve damage, including numbness and/or weakness along the posterior side of the lower extremity.  Pain in the back of the thigh that may also travel down the leg.  Pain that worsens when sitting for long periods of time.  Occasionally, pain in the back or buttock. CAUSES  Inflammation of the sciatic nerve is the cause of sciatica. The inflammation is due to something irritating the nerve. Common sources of irritation include:  Sitting for long periods of time.  Direct trauma to the nerve.  Arthritis of the spine.  Herniated or ruptured disk.  Slipping of the vertebrae (spondylolisthesis).  Pressure from soft tissues, such as muscles or ligament-like tissue (fascia). RISK INCREASES WITH:  Sports that place pressure or stress on the spine (football or weightlifting).  Poor strength and flexibility.  Failure to warm up properly before activity.  Family history of low back pain or disk disorders.  Previous back injury or surgery.  Poor body mechanics, especially when lifting, or poor posture. PREVENTION   Warm up and stretch properly before activity.  Maintain physical fitness:  Strength, flexibility, and endurance.  Cardiovascular fitness.  Learn and use proper technique, especially with posture and lifting. When possible, have coach correct improper technique.  Avoid activities that place stress on the spine. PROGNOSIS If treated properly, then sciatica usually resolves within 6 weeks. However, occasionally surgery is necessary.  RELATED COMPLICATIONS   Permanent nerve damage, including pain, numbness, tingle, or weakness.  Chronic back pain.  Risks of surgery: infection,  bleeding, nerve damage, or damage to surrounding tissues. TREATMENT Treatment initially involves resting from any activities that aggravate your symptoms. The use of ice and medication may help reduce pain and inflammation. The use of strengthening and stretching exercises may help reduce pain with activity. These exercises may be performed at home or with referral to a therapist. A therapist may recommend further treatments, such as transcutaneous electronic nerve stimulation (TENS) or ultrasound. Your caregiver may recommend corticosteroid injections to help reduce inflammation of the sciatic nerve. If symptoms persist despite non-surgical (conservative) treatment, then surgery may be recommended. MEDICATION  If pain medication is necessary, then nonsteroidal anti-inflammatory medications, such as aspirin and ibuprofen, or other minor pain relievers, such as acetaminophen, are often recommended.  Do not take pain medication for 7 days before surgery.  Prescription pain relievers may be given if deemed necessary by your caregiver. Use only as directed and only as much as you need.  Ointments applied to the skin may be helpful.  Corticosteroid injections may be given by your caregiver. These injections should be reserved for the most serious cases, because they may only be given a certain number of times. HEAT AND COLD  Cold treatment (icing) relieves pain and reduces inflammation. Cold treatment should be applied for 10 to 15 minutes every 2 to 3 hours for inflammation and pain and immediately after any activity that aggravates your symptoms. Use ice packs or massage the area with a piece of ice (ice massage).  Heat treatment may be used prior to performing the stretching and strengthening activities prescribed by your caregiver, physical therapist, or athletic trainer. Use a heat pack or soak the injury in warm water.   SEEK MEDICAL CARE IF:  Treatment seems to offer no benefit, or the condition  worsens.  Any medications produce adverse side effects. EXERCISES  RANGE OF MOTION (ROM) AND STRETCHING EXERCISES - Sciatica Most people with sciatic will find that their symptoms worsen with either excessive bending forward (flexion) or arching at the low back (extension). The exercises which will help resolve your symptoms will focus on the opposite motion. Your physician, physical therapist or athletic trainer will help you determine which exercises will be most helpful to resolve your low back pain. Do not complete any exercises without first consulting with your clinician. Discontinue any exercises which worsen your symptoms until you speak to your clinician. If you have pain, numbness or tingling which travels down into your buttocks, leg or foot, the goal of the therapy is for these symptoms to move closer to your back and eventually resolve. Occasionally, these leg symptoms will get better, but your low back pain may worsen; this is typically an indication of progress in your rehabilitation. Be certain to be very alert to any changes in your symptoms and the activities in which you participated in the 24 hours prior to the change. Sharing this information with your clinician will allow him/her to most efficiently treat your condition. These exercises may help you when beginning to rehabilitate your injury. Your symptoms may resolve with or without further involvement from your physician, physical therapist or athletic trainer. While completing these exercises, remember:   Restoring tissue flexibility helps normal motion to return to the joints. This allows healthier, less painful movement and activity.  An effective stretch should be held for at least 30 seconds.  A stretch should never be painful. You should only feel a gentle lengthening or release in the stretched tissue. FLEXION RANGE OF MOTION AND STRETCHING EXERCISES: STRETCH - Flexion, Single Knee to Chest   Lie on a firm bed or floor  with both legs extended in front of you.  Keeping one leg in contact with the floor, bring your opposite knee to your chest. Hold your leg in place by either grabbing behind your thigh or at your knee.  Pull until you feel a gentle stretch in your low back. Hold __________ seconds.  Slowly release your grasp and repeat the exercise with the opposite side. Repeat __________ times. Complete this exercise __________ times per day.  STRETCH - Flexion, Double Knee to Chest  Lie on a firm bed or floor with both legs extended in front of you.  Keeping one leg in contact with the floor, bring your opposite knee to your chest.  Tense your stomach muscles to support your back and then lift your other knee to your chest. Hold your legs in place by either grabbing behind your thighs or at your knees.  Pull both knees toward your chest until you feel a gentle stretch in your low back. Hold __________ seconds.  Tense your stomach muscles and slowly return one leg at a time to the floor. Repeat __________ times. Complete this exercise __________ times per day.  STRETCH - Low Trunk Rotation   Lie on a firm bed or floor. Keeping your legs in front of you, bend your knees so they are both pointed toward the ceiling and your feet are flat on the floor.  Extend your arms out to the side. This will stabilize your upper body by keeping your shoulders in contact with the floor.  Gently and slowly drop both knees together to one side until   you feel a gentle stretch in your low back. Hold for __________ seconds.  Tense your stomach muscles to support your low back as you bring your knees back to the starting position. Repeat the exercise to the other side. Repeat __________ times. Complete this exercise __________ times per day  EXTENSION RANGE OF MOTION AND FLEXIBILITY EXERCISES: STRETCH - Extension, Prone on Elbows  Lie on your stomach on the floor, a bed will be too soft. Place your palms about shoulder  width apart and at the height of your head.  Place your elbows under your shoulders. If this is too painful, stack pillows under your chest.  Allow your body to relax so that your hips drop lower and make contact more completely with the floor.  Hold this position for __________ seconds.  Slowly return to lying flat on the floor. Repeat __________ times. Complete this exercise __________ times per day.  RANGE OF MOTION - Extension, Prone Press Ups  Lie on your stomach on the floor, a bed will be too soft. Place your palms about shoulder width apart and at the height of your head.  Keeping your back as relaxed as possible, slowly straighten your elbows while keeping your hips on the floor. You may adjust the placement of your hands to maximize your comfort. As you gain motion, your hands will come more underneath your shoulders.  Hold this position __________ seconds.  Slowly return to lying flat on the floor. Repeat __________ times. Complete this exercise __________ times per day.  STRENGTHENING EXERCISES - Sciatica  These exercises may help you when beginning to rehabilitate your injury. These exercises should be done near your "sweet spot." This is the neutral, low-back arch, somewhere between fully rounded and fully arched, that is your least painful position. When performed in this safe range of motion, these exercises can be used for people who have either a flexion or extension based injury. These exercises may resolve your symptoms with or without further involvement from your physician, physical therapist or athletic trainer. While completing these exercises, remember:   Muscles can gain both the endurance and the strength needed for everyday activities through controlled exercises.  Complete these exercises as instructed by your physician, physical therapist or athletic trainer. Progress with the resistance and repetition exercises only as your caregiver advises.  You may  experience muscle soreness or fatigue, but the pain or discomfort you are trying to eliminate should never worsen during these exercises. If this pain does worsen, stop and make certain you are following the directions exactly. If the pain is still present after adjustments, discontinue the exercise until you can discuss the trouble with your clinician. STRENGTHENING - Deep Abdominals, Pelvic Tilt   Lie on a firm bed or floor. Keeping your legs in front of you, bend your knees so they are both pointed toward the ceiling and your feet are flat on the floor.  Tense your lower abdominal muscles to press your low back into the floor. This motion will rotate your pelvis so that your tail bone is scooping upwards rather than pointing at your feet or into the floor.  With a gentle tension and even breathing, hold this position for __________ seconds. Repeat __________ times. Complete this exercise __________ times per day.  STRENGTHENING - Abdominals, Crunches   Lie on a firm bed or floor. Keeping your legs in front of you, bend your knees so they are both pointed toward the ceiling and your feet are flat on the   floor. Cross your arms over your chest.  Slightly tip your chin down without bending your neck.  Tense your abdominals and slowly lift your trunk high enough to just clear your shoulder blades. Lifting higher can put excessive stress on the low back and does not further strengthen your abdominal muscles.  Control your return to the starting position. Repeat __________ times. Complete this exercise __________ times per day.  STRENGTHENING - Quadruped, Opposite UE/LE Lift  Assume a hands and knees position on a firm surface. Keep your hands under your shoulders and your knees under your hips. You may place padding under your knees for comfort.  Find your neutral spine and gently tense your abdominal muscles so that you can maintain this position. Your shoulders and hips should form a rectangle  that is parallel with the floor and is not twisted.  Keeping your trunk steady, lift your right hand no higher than your shoulder and then your left leg no higher than your hip. Make sure you are not holding your breath. Hold this position __________ seconds.  Continuing to keep your abdominal muscles tense and your back steady, slowly return to your starting position. Repeat with the opposite arm and leg. Repeat __________ times. Complete this exercise __________ times per day.  STRENGTHENING - Abdominals and Quadriceps, Straight Leg Raise   Lie on a firm bed or floor with both legs extended in front of you.  Keeping one leg in contact with the floor, bend the other knee so that your foot can rest flat on the floor.  Find your neutral spine, and tense your abdominal muscles to maintain your spinal position throughout the exercise.  Slowly lift your straight leg off the floor about 6 inches for a count of 15, making sure to not hold your breath.

## 2014-05-09 NOTE — Progress Notes (Signed)
[employer] Id# [id number] [id number]

## 2014-05-09 NOTE — Progress Notes (Signed)
Patient is 29 y.o. [id number].  +FM, denies LOF, VB.  Overall feeling well. - right sciatic pain: will make next appt with Dr. Adrian BlackwaterStinson for possible O&M - back pain: rx flexeril

## 2014-05-30 ENCOUNTER — Encounter: Payer: Self-pay | Admitting: Family Medicine

## 2014-05-30 ENCOUNTER — Ambulatory Visit (INDEPENDENT_AMBULATORY_CARE_PROVIDER_SITE_OTHER): Payer: Medicaid Other | Admitting: Family Medicine

## 2014-05-30 VITALS — BP 133/83 | HR 107 | Temp 98.2°F | Wt 282.2 lb

## 2014-05-30 DIAGNOSIS — Z3481 Encounter for supervision of other normal pregnancy, first trimester: Secondary | ICD-10-CM

## 2014-05-30 DIAGNOSIS — O99212 Obesity complicating pregnancy, second trimester: Secondary | ICD-10-CM

## 2014-05-30 DIAGNOSIS — Z789 Other specified health status: Secondary | ICD-10-CM

## 2014-05-30 LAB — POCT URINALYSIS DIP (DEVICE)
BILIRUBIN URINE: NEGATIVE
Glucose, UA: NEGATIVE mg/dL
Hgb urine dipstick: NEGATIVE
Ketones, ur: NEGATIVE mg/dL
Nitrite: NEGATIVE
Protein, ur: NEGATIVE mg/dL
SPECIFIC GRAVITY, URINE: 1.01 (ref 1.005–1.030)
Urobilinogen, UA: 0.2 mg/dL (ref 0.0–1.0)
pH: 5.5 (ref 5.0–8.0)

## 2014-05-30 NOTE — Progress Notes (Signed)
Followup U/S 06/13/14 @ 930, [place] phone interpretation used.

## 2014-05-30 NOTE — Progress Notes (Signed)
Leukocytes: Trace  [employer] ID: [phone]

## 2014-05-30 NOTE — Progress Notes (Signed)
[employer] used  Dating adjusted to that done at first u/s--anatomy limited--will reschedule f/u scan

## 2014-05-30 NOTE — Patient Instructions (Addendum)
Saddexda Bilood ee labaad ee Uurka bilood labaad waa ka toddobaadka 13 illaa toddobaadka 28, bilood 4 ilaa 6. bilood labaad [place] and Miqueloninta badan waa waqti marka aad dareento in aad fiican. Jirkaaga ayaa sidoo kale laga beddelo si uurka, oo aad bilowdaan inaad dareentid fiicnaan jir. Sida caadiga ah, walac ayaa yareeyo ama gabi ahaanba joojin, waxaa laga yaabaa inaad tamar ka badan, oo aad leedahay waxaa laga yaabaa in la kordhiyo cunto xumo. bilood labaad sidoo kale waa waqti marka ilmaha caloosha ku waa si degdeg ah u koraya. Dhamaadkii bishii lixaad, ilmaha caloosha ku waa muddo dheer oo ku saabsan 9 hal beeg, miisaankana ku saabsan 1 pounds. tahay in aad bilaabi doonaa si ay u dareemaan dhaqaaqo ilmaha (nooleeya) inta u dhaxaysa 18 iyo 20 toddobaad ee [place]ao [place] and Princ[place]uurka. JIRKA ISBEDELKA Jirkaaga maraa isbedel badan Peter Miniuminta aad uurka leedahay. isbedel ayaa ku kala duwan yihiin dumar naagtii. Your miisaanka sii wadi doonaa in [place] Valleyla kordhiyo. Waxaad dareemi doonta calooshaada hoose ferjiga. Waxaad bilaabi kartaa in aad ka heli calaamadaha fiditaanka aad miskaha, caloosha, iyo naasaha. Waxaa kugu dhici karo madax in la River Bendxakamayn kara dawooyin ansixiyo bixiyaha xanaanada caafimaadkaaga. Waxaad kaadi laga yaabaa in marar badan sababta oo ah ilmaha caloosha ku dadaashaa on kaadi. Waxaa kugu dhici karo ama ku wadaan inay leeyihiin laab natiijo ahaan aad uurka leedahay. Waxaad horumariyaan laga yaabaa in calooshu sababtoo ah hormoonada qaarkood sababaya muruqyada in hardiyi qashinka iyada oo mindhicirrada si aad hoos u dhigi. Waxaa kugu dhici karo babaasiirta ama barara, ferjiga xididdada (xididdada qaradhku). Waxaa laga yaabaa in dhabar xanuunka sababta oo ah korodhka miisaanka iyo hormoonnada uurka nasto laabatooyinka dhexdooda lafaha miskaha ee aad iyo iyadoo ay sabab u wareejin ah in miisaanka iyo murqaha taageera aad dheelitirka. Naasahaaga sii wadi doontaa in ay koraan iyo damqasho. ciridka Your dhiig laga yaabaa oo waxaa laga  yaabaa in ay xasaasi cadeyga iyo findhicilashada. dhibco madow ama guntan (kloasma, maaskaro ee Arle[place] Creekuurka) waxa dhici karta on wajigaaga. Tani waxay u badan tahay way libdhi doonaa ka dib marka ilmuhu dhasho. line A madow ka button calooshaada aagga shuunka (nigra linea) ka soo bixi kara. Tani waxay u badan tahay way libdhi doonaa ka dib marka ilmuhu dhasho. laga yaabaa in aad isbeddel ah ee timahaaga. Tacy LearnKuwaas waxaa ka mid Swaz[place]andkaraa dhagid timahaaga, koritaanka saa'idka ah, iyo isbedelada ka sameysan. [place] qaarkood waxay kaloo leeyihiin Kimberly-Clarktimaha inta lagu guda jiro ama [place], ama timaha in uu dareensan yahay qalalan ama khafiif ah ka dib. Your timaha u badan tahay in si caadi ah ku soo laaban doonaa ka dib marka ilmahaagu dhasho. Maxaa laga filayaa AAD TAGTAY DHALASHADA Intii lagu guda jiray booqasho dhalmada caadiga ah: la miisaamay doonaa in aad si loo hubiyo in Finlandadiga iyo ilmaha caloosha ku sida caadiga ah u koraya. Cadaadiska dhiiggaaga ayaa la qaadi doonaa. Your caloosha la qiyaasi doonaa si ay ula socdaan koritaanka ilmahaaga. garaaca wadnaha ayaa uurjiifka la dhegaysan doonaa in. Natiijada imtixaanka kasta oo ka booqashada hore laga wada Kelly Serviceshadli doono. Bixiyaha xanaanada caafimaadka waxaa laga yaabaa in aad weydiiso: Sidee aad dareensan tahay. Haddii aad dareensan tahay dhaqaaqo ilmaha. Haddii aad wax calaamado aan caadi ahayn, sida dareere, dhiig, madax xanuun daran, ama casiraad caloosha dusaya. Haddii aad qabto wax su'aalo ah. Baaritaanno kale in la sameeyo waxaa laga yaabaa in [place] and Miqueloninta aad bilood labaad waxaa ka mid ah: Tijaabooyinka dhiiga in lagu hubiyo: heerarka bir Low (anemia). Gestational diabetes (inta u dhaxaysa 3924 iyo 628 toddobaad). antibodies Rh. baaritaanka kaadida in lagu hubiyo cudur, sonkorowga, ama protein Eritreakaadida. Ultrasound si loo xaqiijiyo koritaanka habboon iyo horumarka ilmaha.  amniocentesis An si loo hubiyo dhibaatooyinka hidde suurto gal. screens Uurjiifka for fajaha  dhabarka iyo Down syndrome. [facility] Ka fogow cabidda sigaarka oo dhan, iyo geedo yaryar, [place] River Estateskhamriga, iyo daroogada unprescribed. kiimikada Kuwani waxay saamayn ku formation iyo koritaanka ilmaha. Raac tilmaamaha bixiyahaaga daryeelka caafimaad ee ku saabsan isticmaalka daawada. Waxa jira dawooyin waa mid ammaan ah ama ammaan ahayn in la Vanuatuqaato inta lagu guda jiro [place]. Jimicsiga oo kaliya sida uu bixiyaha xanaanada caafimaadkaaga si toos ah. Fahmida casiraad minka waa calaamad wanaagsan in la jooji jimicsiga. Continue in la cuno si joogto ah, cunto caafimaad leh. Xiro rajabeeto a taageero fiican u danqasho naasaha. Ha isticmaalin tubbo biyo kulul, uumi Free Soilqolalka, Two Strikeama Baraakada. Gasho aad suunka kursiga waqtiyada oo dhan markii baabuur-wadidda. Iska ilaali hilibka cayriin, cheese ceedhin, sanaadiiqda saxarada bisadda, oo ciidda loo isticmaalo by bisadaha. Kuwani qaado jeermiska keeni kara cilladaha lagu dhasho ee ilmaha. Qaado fiitamiinnada Selinda Flavinxilliga [place]. Isku day inaad qaadato saxaro jilciye (haddii bixiyaha daryeelka caafimaadka oo ansixiyay) haddii aad dareento caloosha. Cun cuntooyin ay buunshuhu ku sarreeyaan-dheeraad ah, sida khudaar daray ah ama miro iyo miraha isu dhan. Cab cabitaano badan si aad kaadida cad ama jaalle ah oo caddaado. Qaado bad diiran Sitsi oo dejiyo xanuun ah ama lur Colombiakeena babaasiirta. Isticmaal labeen hemorrhoid haddii bixiyaha daryeelka caafimaadka oo meel-mariyay. Haddii aad yeeshaan xididada qaradhku, xiran tuubo taageero. Sare aad cagaha ilaa 15 daqiiqadood, 3-4 jeer maalintii. Yaree milixda in aad cunto. Iska ilaali qaadista wax culus, xirtaan kabo cedhibta ku yar yahay, iyo ku dhaqmaan qaab fiicanna. Naso iyada oo lugahaagu Qolol haddii aad qabtid casiraad ama dhabar xanuunka hooseeyo. Booqo dhakhtarkaaga ilkaha haddii aad aan weli la demin Talmageinta aad [place] leedahay. Isticmaal caday jilicsan si aad ilkaha iyo tur marka aad nadiifin. Alfredo BachXiriirka A galmada la sii  wadi laga yaabaa in haddii bixiyaha xanaanada caafimaadkaaga haddii kale idiin Dardaarmi. Sii wad in aad dhammaan booqashooyinka aad Sao [place] and Principeuurka tago by bixiyaha xanaanada caafimaadkaaga si toos ah. Raadso daryeel caafimaad haddii: Waxaad wareer. Waxaad xanuun Cayugakhafiif ah miskaha, cadaadiska Albertamiskaha, ama xanuun Costa Ricakabashadiisa aagga caloosha. Waxaad lallabo joogto ah, matag, ama shuban. Waxaad dheecaan xun oo qarmuun siilka. Waxaad xanuun Texas Instrumentsmarka la kaadinayo. RAADSO daryeel caafimaad oo degdeg HADDII: Waxaad qandho. Waxaad dusaya dheecaan siilkaaga. Waxaad dhiig ama dhiig hoostaada ka. Waxaad casiraad caloosha daran ama xanuun. Waxaad korodhka miisaanka si degdeg ah ama khasaaraha. Waxaad neefta oo ku qabata xanuunka xabadka. Inaad dareentid barar lama filaan ah ama xad dhaaf ah wejigaaga, gacmaha, kuraamaha, cagaha, ama lugaha. Waxaad aan dareemay ilmahaaga guurto in ka badan saacad. Waxaad madax xanuun daran oo ha iska daawo tagaan. Waxaad araggaagu isbeddelo. Document Released: 12/22/2000 Document Revised: 01/02/2013 Document Dib: 02/29/2012 ExitCare Information Bukaanka  2015 [employer]. Macluumaadkan looguma talagalin inay talo siin aad ka hesho daryeel bixiyaha caafimaadka. Hubi inaad kala hadashaa wixii su'aalo aad qabto la bixiyaha xanaanada caafimaadkaaga.  Naas-nuujinta Go'aansiga naaska waa mid ka mid ah xulashada ugu fiican ee aad samayn kartaa adiga iyo ilmahaaga. Isbeddel ku hormoonnada Nepalxilliga uurka sababa unugyada naaska si aad u koraan iyo kordhiyaa tirada iyo size of tuubada caanaha aad. hormoonnada waxa kale oo u oggolaan borotiinada, sonkorta, iyo dufan ka qulqulka dhiigga in la sameeyo caanaha naaska ee qanjidhada caanaha soo saara. Hormoonnada hortago caanaha naaska ka la sii daayey ka hor inta ilmahaagu dhasho iyo sidoo kale socodka caanaha isla markiiba dhalashada ka dib. Marka naasnuujinta ayaa bilaabay, waana fikirro ilmahaaga, iyo sidoo kale isaga ama iyada caanonuug ama  qaylinaya, kicin kara in la sii daayo oo caano ah ka qanjirada caanaha soo  Karrie Doffing, Your Baby caanaha Your ugu horeysay (dambar) ka caawisaa habka shaqo dheefshiidka ilmahaaga wanaagsan. Waxaa jira antibodies in caanaha aad ka caawiya ilmahaaga dagaalamo cudurada. Ilmahaagu waxa uu leeyahay dhacdooyinka a hoose ee neefta, xasaasiyad, iyo lama filaan ah dhimashada dhallaanka syndrome. The nafaqooyinka caanaha naaska waa wanaagsan ee ilmahaaga ka qaaciidooyinka dhallaanka iyo waxaa si gaar ah loogu talagalay baahida ilmahaaga. Caanaha naaska hagaajinaysaa [employer]. Ilmahaagu waa yar tahay in ay yeeshaan xaaladaha kale, sida cayilka carruurnimada, neefta, ama nooca 2 mellitus diabetes. Waayo, waxaad Naas-nuujinta waxay ka caawisaa in la abuuro bond aad u gaarka ah ee u dhexeeya adiga iyo ilmahaaga. Naasnuujintu waa ku haboon. Caanaha naaska had iyo jeer waxaa laga heli karaa heerkulka saxda ah iyo [place]. Naas-nuujinta waxay ka caawisaa si ay u gubaan calories oo kaa caawinaysaa in aad lumiso miisaanka helay [place] lagu guda Francoise Ceo. Naas ka dhigaysa heshiiska ilmo galeenka si aad u size prepregnancy ay si dhakhso ah oo loogu talla dhiig (uub) ka dib umusha. Naas-nuujinta ayaa kaa yareyn kara halista nooca 2aad ee diabetes mellitus, osteoporosis, iyo naaska ama kansarka ugxansiduhu dambe ee nolosha. CALAAMADAHA [facility] WAA GAAJADA Calaamadaha Hore ee Gaajada feejignaanta la kordhiyo ama waxqabad. Iskala. Movement of madaxa dhinac ilaa dhinac. Movement of madaxa iyo furitaanka afka Luciana Axe of afka ama dhabanka waxaa stroked (afjarno). Kordhinta jaqaan dhawaaqa, dibnaha, muusiqada, taahid, ama xooga. Gacan-ka-afka [id number]. caanonuug korodha faraha ama gacmaha. Calaamadaha dambe ee Gaajada Fussing. Ka dhex qaylinaya. Ba'an Calaamadaha Gaajada Calaamadaha gaajo xad dhaaf ah u baahan doonaa dejinaaya oo u tacsiyaynaysay  hor ilmahaaga awoodaan in ay si guul leh u naasnuujin noqon doonaa. Ha sugin calaamadaha soo socda gaajo xad dhaaf ah ay u dhacaan ka hor inta aadan la bilaabo naas-nuujinta: Degenaansho la'aanta. A cod weyn, qaylada xoogga leh. Qeyliyo. AASAAS NUUJINTA Naas Bilaabayo Raadi meel raaxo leh in fadhiiso ama jiifso, oo aad qoorta iyo dhabarka si fiican u taageeray. Dhig barkimo ama buste u Duubayno hoos ilmahaaga in ay isaga ama iyada ku soo dejin heerka naaska (haddii aad ka fadhiisanayso). barkimo Nursing waxaa si gaar ah loogu talagalay in lagu caawiyo gacmahaaga iyo ilmahaaga taageero [employer]. Hubi in caloosha ilmaha waxaa soo food saartay calooshaada. Qunyar salaax naasahaaga. Iyadoo farahaaga, massage ka derbiga xabadka aad xagga ibta dhaqaaq wareeg ah. Tani waxay dhiiri socodka caanaha. Waxaa laga yaabaa inaad u baahan tahay in ay sii wadaan tallaabadan lagu jiro quudinta haddii caanaha si tartiib ah u baxayaan. Taageer naaska la 4 faraha hoose iyo suulka aad kor ku ibta. Hubi farahaaga yihiin iyo sidoo kale ka ibta naaska oo afka ilmahaaga iska. Stroke bushimaha ilmahaaga si tartiib ah oo aad farta ama ibta. Marka ilmahaaga afkiisu waa u furan ballaaran ku filan, si deg deg ah si aad u soo qaado ilmahaaga naaska, gelinayn ibta oo idil iyo sida badan ee aagga midab agagaarka ibta (ibta) intii suurto gal ah ee ilmahaaga afkayga ma gelin. ibta More waa in ay la arki karo ka sareeyaan dibinta sare ee ilmahaaga ka hoos dibinta hoose. carrabka ilmahaagu waa inuu u dhexeeyo ama xanjo iyada hoose iyo naaska. Hubi in ilmahaaga afkiisa si sax ah u taagan hareeraha ibta (shabaqa). bushimihiisa ilmahaaga waa in la abuuro seal a on naaska iyo dibadda loo soo jeestay (everted). Waa caadi in ilmahaaga nuugo oo ku saabsan 2-3 daqiiqo si ay u bilaabaan socodka caanaha naaska. Johney Maine Baridda ilmahaaga sida loo qabsado on inay naaska si sax ah waa mid aad u muhiim ah. xire An khaldan waxay Lanetta Inch xanuun  ibta oo hoos u siin caanaha aad iyo miisaanka masaakiinta faa'iido ee ilmahaaga. Sidoo kale, haddii [employer] rogtid oo aad ibta naaska si sax ah, isaga ama iyada waxaa laga yaabaa in la gooyo qaar ka mid ah hawada lagu jiro quudinta. Murrell Redden waxay ka dhigi kartaa ilmahaagu ooyo. Daacsiinta ilmahaaga marka aad beddelaan naasahaaga inta lagu jiro quudinta ayaa kaa caawin kara in laga takhaluso hawada. Si kastaba ha ahaatee, wax ku barayay in ilmahaagu qabsado on si sax ah weli waa habka ugu fiican si looga hortago jeclayn ka liqidda hawada halka naas nuujinta. Calaamadaha in ilmahaaga ayaa si guul leh shabaqa on inay ibta: tugging Silent ama caanonuug aamusa, oo aan aad u [place] xanuun. Liqidda [employer] u dhaxaysa 3-4 Rogue Jury. dhaqdhaqaaqa Murqo kor ku xusan oo hore ee isaga ama iyada dhegaha halka nuugo. Calaamadaha in ilmahaaga ayaa si guul ah ma shabaqa on in ibta naaska: Nuugista dhawaaqa ama dilin dhawaaqyada ka soo ilmahaaga halka naas nuujinta. xanuunka ibta. Haddii aad u malaynayso in [place] uusan shabaqa ku sax, simbiriirixan fartaada galay geeska of afka ilmahaaga in ay jebiyaan nuugid iyo meel u dhexaysa ciridka ilmahaaga. -Dayga bilaabidda naasnuujinta mar kale. Calaamadaha Naas Successful Calaamadaha ka ilmahaaga: hoos u dhac A tartiib ah in [place] ama joojin buuxda oo caanonuug. Hoos hurda. [place] of isaga ama [place]. Haynta ee qadar yar oo ah caano ee afkiisa ama afkeeda. Ogolaato go naaska aad asaga ama ayada by. Calaamadaha aad ka: Naasaha in ayaa ku soo kordhay ee suga, miisaanka, iyo size 1-3 saacadood ka dib quudinta. Naasaha kuwa jilicsan isla markiiba ka dib naasnuujinta. mugga caanaha la kordhiyo, sidoo kale isbedel ku joogta caano iyo midab by maalinteedii shanaad naas-nuujinta. Ibta oo aan xanuun, dillaac, ama dhiig-bax. Calaamadaha Taasi Your Baby Helay Caano Ku Filan Qoynta ugu yaraan 3 xafaayadda in muddo  24-saacadood ah. kaadida wuxuu noqon waa cad iyo jaalle midab by da'da 5 maalmood. Ugu yaraan 3 saxaro in muddo 24-saac ah oo ay 5 maalmood da'da. saxarada waa in uu ahaadaa jilicsan iyo huruud ah. Ugu yaraan 3 saxaro in muddo 24-saac ah oo ay muddo 7 maalmood da'da. saxarada waa in uu ahaadaa iniinyaha iyo huruud ah. No khasaare ah oo miisaankiisu ka badan 10% ee miisaanka dhalashada weyn inta lagu guda jiro 3 maalmood ee ugu horreeya da'da. Celceliska korodh miisaan oo ah 4-7 ounces (113-198 g) toddobaadkii da'da 4 maalmood ka dib. miisaanka Joogtaynta faa'iido maalin kasta by 5 maalmood da'da, oo aan miisaan lumis ka dib da'da 2 todobaad. Ka dib markii quudinta ah, ilmahaaga ku tufi kartaa ilaa qadar yar. Tani waa caadi. Miles Costain jeer naas-nuujinta iyo DURATION quudinta badan kaa caawin doona inaad ka dhigi caano badan oo ka hortagi kara ibta xanuun iyo xidhitaanka naaska. Naas marka aad dareento baahida loo qabo in la yareeyo tirada buuxda oo aad naasaha ama marka ilmahaagu wuxuu muujinayaa calaamadaha gaajada. Tan waxa loo yaqaan "naas-nuujinta ku saabsan baahida loo qabo." Iska ilaali bandhigid mujuruqa in [place] aad ka shaqeeya in la dhiso naas-nuujinta (ee 4-6 wiig ee ugu horeeya ka dib marka ilmahaagu dhasho). Ka dib wakhtigan waxaad dooran kartaa in aad isticmaasho mujuruqa. Cilmi baaris ayaa muujisay isticmaalka in [place] lagu guda jiro sanadka ugu horeeya nolosha ilmaha hoos u halista ah ee lama filaan ah dhimashada dhallaanka syndrome (SIDS). U ogolow ilmahaaga in ay ku quudiyaan on naas kasta ilaa iyo inta uu rabo. Naas ilaa ilmahaaga la dhammeeyo quudinta. [facility] ilmahaaga unlatches yahay ama u dhacaa hurda halka quudinta naaska ka horeysay, naaska labaad. Maxaa yeelay dhalatay  inta badan waa hurdo in dhowrka toddobaad ee ugu horreeyay ee nolosha, waxaa laga yaabaa in aad u baahan tahay si ay u toosin ilmahaaga si aad u hesho isaga ama iyada in ay ku quudiyaan. jeer Naas kala  duwanaan doonaa ilmahaaga si ilmahaaga. Si kastaba ha ahaatee, xeerarka soo socda shaqayn karo, isagoo ah hanuuniye si ay Wellsite geologist in aad hubiso in ilmahaagu si fiican quudin: Dhallaanka (ilmaha 4 todobaad jir ama ka yar) naaska laga yaabaa in 1-3 saacadood kasta saacadood [place]. Dhallaanka waa in [place] tago muddo ka badan 3 saacadood [place] lagu jiro Wellman ama 5 saacadood habeenkii aan naas-nuujinta. Waa in aad naaska ilmahaaga ugu yaraan 8 jeer muddo 24-saacadood ah ilaa aad ka bilaabaan inay soo bandhigo cuntada adag in ilmahaaga ilaa da'da 6 bilood. Listo caanaha naaska Lisida iyo kaydinta caanaha naaska kuu ogolaanaya in aad si loo hubiyo in ilmahaagu si gaar quudin caanaha [place], iyo xataa waqtiyada marka aanad awoodin inaad naaska. Tani waxay si gaar ah muhiim, haddii aad dib u shaqeyn doonaa [place] aad weli naaska ama marka aadan inuu joogo [place] lagu guda jiro [place]. taliye Your nuujinta ku siin karaan tilmaamaha ku saabsan sida dheer ay nabdoon tahay in [place]. lisa naaska A waa mashiin kuu ogolaanaya inaad lisidda caanaha naaska ka galay dhalo nadiif ah. The caanaha naaska gareeyeen ka dibna lagu Lenord Fellers qaboojiyaha ama qaboojiyaha. bambooyin naaska Qaar ka mid ah waxaa ka shaqeeya oo uu gacanteeda qabtay, halka qaar kalena ay u isticmaalaan korontada. Weydii la taliyaha aad nuujinta nooca shaqayn doona adiga kuu fiican. bambooyin naaska laga Sudie Grumbling, laakiin qaar ka mid ah isbitaalada iyo kooxaha taageerada naasnuujinta kiraysan bambooyin naaska ee ku salysan bishiiba. taliye nuujinta A idin bari karaa sida loo dhiibi caanaha [place] express, haddii aad jeceshahay in aadan isticmaalin bamka ah. Curley Spice AAD naaska Ibta noqon kartaa qalalan, dillaac, iyo xanuun halka naaska. The talooyinka soo socda ayaa kaa caawin kara in aad naasaha sii moisturized iyo caafimaad: Iska ilaali isticmaalaya saabuun ibta. Xiro rajabeeto taageero. [address]  oo aan loo [place], [place] xirashada kalkaalinta iyo haanta gaar ah waxaa loogu talagalay in ay oggolaadaan in naasahaaga waayo, naas-nuujinta oo aan qaadan off aad rajabeeto dhan ama sare. Iska ilaali xiran xirashada underwire-style ama xirashada aad u dhagan. Air qallaji ibahaaga 3-4 daqiiqo ka dib markii [place]. Isticmaal pads rajabeeto suuf oo kaliya si uu u nuugo caanaha naaska xaday. Will Bonnet caanaha naaska u dhexeysa quudinta waa caadi. Isticmaal lanolin ibta ka dib naasnuujinta. Lanolin caawiyaa si ay u ilaaliyaan qoyaanka caqabad caadiga ah maqaarka ee. Haddii aad isticmaasho lanolin saafi ah, uma baahnid inaad u baahan tahay inaad maydho off ka hor mar quudinta ilmahaaga. lanolin daahir ma aha sun ah si ay ilmahaaga. Waxa kale oo laga yaabaa in aad gacanta ku muujiyaan dhawr dhibcood oo caanaha naaska iyo si tartiib ah duugin caano gaadhay ibta aad iyo u ogolaan caanaha si qalalan hawada. In dhowrkii toddobaad ee dhalmada ka dib marka hore, haweenka qaarkood dareemaan Laabta aad u buuxa [phone]). Darar Dedra Skeens dhigi kartaa naasahaaga dareemaan culus, diiran, oo dhaylo ah in la taabto. danbow fooca 3-5 maalmood gudahood ka dib markii aad dhasho. The talooyinka soo socda ayaa kaa caawin kara xidhitaanka istareexsan: Gebi madhin naasahaaga halka naaska ama ku listo. laga yaabaa in aad rabto in aad bilowdo by codsanaya kulaylka diiran, qoyan (qubayska ama la shukumaano gacanta biyo-qooyay diiran) wax yar ka hor quudinta ama ku listo. Murrell Redden waxay Augusto Gamble wareegga iyo waxay Dedra Skeens caawisaa socodka caanaha. Haddii ilmahaagu aanu si buuxda  naasahaaga madhin halka naas nuujinta, lisidda kasta oo caano dheeraad ah ka dib markii isaga ama iyada la dhammeeyo. Evie Lacks rajabeeto a Audiological scientist (nursing ama joogto ah) ama taangiga top 1-2 maalmood in ay calaamad u ah jirkaaga si yar hoos u-soo-saarka caanaha. Codso baraf si aad naasaha, haddii tani ay tahay mid aad u raaxo aad u. Hubi in ilmahaagu shabaqa on  oo si fiican u taagan halka naas nuujinta. Haddii xidhitaanka socoto 48 saac ee soo socda talooyin ka dib, la xiriir bixiyahaaga daryeelka caafimaadka ama la taliyaha nuujinta ah. SARAYSA TALOOYINKA DARYEELKA CAAFIMAADKA inta aad naaska nuujineyso Cun cuntooyin caafimaad leh. Gweneth Dimitri u dhaxaysa Sudan cuntada iyo cuntada fudud, [place] 3 of [place]. Sababtoo ah waxa aad cuni saameeyaa caanaha naaska aad, qaar ka mid ah cuntooyinka dhallaankaaga ka dhigi karaan in ka badan xanaaq badan intii caadiga ahayd. Ka fogow cunista cuntooyinka haddii aad hubto in ay si xun u saamaynaya dhallaankaaga. Cab caanaha, casiirka furutada, iyo biyo si aad u qanciso harraad (10 koob maalintii). inta badan naso, nasasho, oo ay sii wadaan in ay qaataan vitamins aad dhalmada si looga hortago daal, stress, iyo dhiig la'aan. Continue jeegaga is-wacyiga naaska. Iska ilaali qaadka iyo sigaarka sigaarka. Iska ilaali isticmaalka daroogada iyo khamriga. Daawooyinka qaarkood laga yaabo in waxyeello u leh ilmaha u gudbin [place]. Waxaa muhiim ah inaad weydiiso bixiyaha xanaanada caafimaadkaaga ka hor inta aysan wax daawo ah, oo ay ku jiraan oo dhan-the-counter iyo dawooyinka la qoro iyo sidoo kale vitamin iyo supplements dhirta. [place]axaa suurto gal ah in aad uur yeelato halka naas nuujinta. [employer] [place], weydii bixiyaha xanaanada caafimaadkaaga ku saabsan fursadaha in uu noqon doono ammaan u ah ilmahaaga. Raadso daryeel caafimaad haddii: [place]aad dareemi sida aad rabto in aad joojiso naas nuujinta ama u noqday careysan naas-nuujinta. Waxaad naasaha oo ku xanuuna ama ibta. ibta waxaa dillaac ama dhiig-bax. Naasahaaga waa guduudan yahay, waana tabar daranyahay, ama diiran. [place] meel bararsan labada naasaha. [place]ad qandho ama qar-qaryo. [place]aad lalabbo ama matagid. Waxaad dheecaan kale oo aan ahayn caanaha naaska ka ibta. Naasahaaga ma noqon ka hor quudinta buuxa by [employer] dib umusha. Waxaad dareemeysaa murugo iyo niyad-jab. [place] waa mid aad u hurdo si fiican u cunaan. [place] waa dhib hurdada. [place] waa qoynta ka yar 3 xafaayadda in muddo 24-saacadood ah. [place] waxa uu leeyahay in ka yar 3 saxaro in muddo 24-saacadood ah. Maqaarka ilmahaaga ama qaybta cad ee indhaha isaga ama iyada uu noqonayaa huruud ah. [place] aan miisaan by 5 maalmood da'da. RAADSO daryeel caafimaad oo degdeg HADDII: [place] waa dhigin mid daal (itaal darnaadaa) oo uusan rabin ilaa toosin iyo quudiyo. [place] waxa uu yeeshay qandho ah oo aan la sharrixin. Document Released: 12/28/2004 Document Revised: 01/02/2013 Document Dib: 06/21/2012 [employer] Information [place]  [address] [place], [place]. Macluumaadkan looguma talagalin inay talo siin aad ka hesho daryeel bixiyaha caafimaadka. Hubi inaad kala hadashaa wixii su'aalo aad qabto la bixiyaha xanaanada caafimaadkaaga.

## 2014-06-13 ENCOUNTER — Ambulatory Visit (HOSPITAL_COMMUNITY)
Admission: RE | Admit: 2014-06-13 | Discharge: 2014-06-13 | Disposition: A | Discharge status: Home / Self Care | Payer: Medicaid Other | Source: Ambulatory Visit | Attending: Family Medicine | Admitting: Family Medicine

## 2014-06-13 ENCOUNTER — Other Ambulatory Visit: Payer: Self-pay | Admitting: Family Medicine

## 2014-06-13 ENCOUNTER — Encounter (HOSPITAL_COMMUNITY): Payer: Self-pay

## 2014-06-13 ENCOUNTER — Other Ambulatory Visit (HOSPITAL_COMMUNITY): Payer: Self-pay | Admitting: Maternal and Fetal Medicine

## 2014-06-13 DIAGNOSIS — Z3A22 22 weeks gestation of pregnancy: Secondary | ICD-10-CM | POA: Diagnosis not present

## 2014-06-13 DIAGNOSIS — O99212 Obesity complicating pregnancy, second trimester: Principal | ICD-10-CM

## 2014-06-13 DIAGNOSIS — Z36 Encounter for antenatal screening of mother: Secondary | ICD-10-CM | POA: Diagnosis not present

## 2014-06-13 DIAGNOSIS — Z3481 Encounter for supervision of other normal pregnancy, first trimester: Secondary | ICD-10-CM

## 2014-06-27 ENCOUNTER — Encounter: Payer: Self-pay | Admitting: Obstetrics and Gynecology

## 2014-06-27 ENCOUNTER — Ambulatory Visit (INDEPENDENT_AMBULATORY_CARE_PROVIDER_SITE_OTHER): Payer: Medicaid Other | Admitting: Obstetrics and Gynecology

## 2014-06-27 ENCOUNTER — Encounter: Payer: Self-pay | Admitting: General Practice

## 2014-06-27 VITALS — BP 133/76 | HR 93 | Temp 98.3°F | Wt 281.6 lb

## 2014-06-27 DIAGNOSIS — Z789 Other specified health status: Secondary | ICD-10-CM

## 2014-06-27 DIAGNOSIS — Z3481 Encounter for supervision of other normal pregnancy, first trimester: Secondary | ICD-10-CM

## 2014-06-27 DIAGNOSIS — O99212 Obesity complicating pregnancy, second trimester: Secondary | ICD-10-CM

## 2014-06-27 DIAGNOSIS — IMO0001 Reserved for inherently not codable concepts without codable children: Secondary | ICD-10-CM | POA: Insufficient documentation

## 2014-06-27 DIAGNOSIS — O9989 Other specified diseases and conditions complicating pregnancy, childbirth and the puerperium: Secondary | ICD-10-CM

## 2014-06-27 DIAGNOSIS — R7611 Nonspecific reaction to tuberculin skin test without active tuberculosis: Secondary | ICD-10-CM

## 2014-06-27 LAB — POCT URINALYSIS DIP (DEVICE)
GLUCOSE, UA: NEGATIVE mg/dL
HGB URINE DIPSTICK: NEGATIVE
Nitrite: NEGATIVE
PH: 6 (ref 5.0–8.0)
PROTEIN: 100 mg/dL — AB
UROBILINOGEN UA: 2 mg/dL — AB (ref 0.0–1.0)

## 2014-06-27 NOTE — Progress Notes (Signed)
Subjective:  Kelli Russell is a 29 y.o. G4P1021 at [redacted]w[redacted]d being seen today for ongoing prenatal care.  Patient reports no complaints.  Contractions: Not present.  Vag. Bleeding: None. Movement: Present. Denies leaking of fluid.   The following portions of the patient's history were reviewed and updated as appropriate: allergies, current medications, past family history, past medical history, past social history, past surgical history and problem list.   Objective:   Filed Vitals:   06/27/14 1013  BP: 133/76  Pulse: 93  Temp: 98.3 F (36.8 C)  Weight: 281 lb 9.6 oz (127.733 kg)    Fetal Status: Fetal Heart Rate (bpm): 152   Movement: Present     General:  Alert, oriented and cooperative. Patient is in no acute distress.  Skin: Skin is warm and dry. No rash noted.   Cardiovascular: Normal heart rate noted  Respiratory: Effort and breath sounds normal, no problems with respiration noted  Abdomen: Soft, gravid, appropriate for gestational age. Pain/Pressure: Present     Vaginal: Vag. Bleeding: None.       Cervix: Not evaluated  Extremities: Normal range of motion.  Edema: Trace  Mental Status: Normal mood and affect. Normal behavior. Normal judgment and thought content.   Urinalysis: Urine Protein: 2+ Urine Glucose: Negative  Assessment and Plan:  Pregnancy: G4P1021 at [redacted]w[redacted]d  1. Encounter for supervision of other normal pregnancy in first trimester Patient is doing well without complaints. 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 Language barrier Interpreter used  3. Maternal morbid obesity, antepartum, second trimester    Preterm labor symptoms and general obstetric precautions including but not limited to vaginal bleeding, contractions, leaking of fluid and fetal movement were reviewed in detail with the patient.  Please  refer to After Visit Summary for other counseling recommendations.   No Follow-up on file.   Catalina Antigua, MD

## 2014-06-27 NOTE — Addendum Note (Signed)
Addended by: Candelaria Stagers E on: 06/27/2014 03:01 PM   Modules accepted: Orders

## 2014-06-27 NOTE — Progress Notes (Signed)
Reports urine has odor and lower back/pelvic pain [employer] interpreter [phone]

## 2014-07-11 ENCOUNTER — Ambulatory Visit (HOSPITAL_COMMUNITY)
Admission: RE | Admit: 2014-07-11 | Discharge: 2014-07-11 | Disposition: A | Discharge status: Home / Self Care | Payer: Medicaid Other | Source: Ambulatory Visit | Attending: Obstetrics and Gynecology | Admitting: Obstetrics and Gynecology

## 2014-07-11 ENCOUNTER — Encounter (HOSPITAL_COMMUNITY): Payer: Self-pay

## 2014-07-11 DIAGNOSIS — Z0489 Encounter for examination and observation for other specified reasons: Secondary | ICD-10-CM | POA: Insufficient documentation

## 2014-07-11 DIAGNOSIS — IMO0002 Reserved for concepts with insufficient information to code with codable children: Secondary | ICD-10-CM | POA: Insufficient documentation

## 2014-07-11 DIAGNOSIS — Z3A26 26 weeks gestation of pregnancy: Secondary | ICD-10-CM | POA: Insufficient documentation

## 2014-07-11 DIAGNOSIS — O99212 Obesity complicating pregnancy, second trimester: Secondary | ICD-10-CM | POA: Diagnosis not present

## 2014-07-11 NOTE — ED Notes (Signed)
Language interpreter unavailable.  [facility]

## 2014-07-25 ENCOUNTER — Encounter: Payer: Self-pay | Admitting: Family Medicine

## 2014-07-25 ENCOUNTER — Ambulatory Visit (INDEPENDENT_AMBULATORY_CARE_PROVIDER_SITE_OTHER): Payer: Medicaid Other | Admitting: Family Medicine

## 2014-07-25 VITALS — BP 114/80 | HR 108 | Temp 98.4°F | Wt 285.2 lb

## 2014-07-25 DIAGNOSIS — Z23 Encounter for immunization: Secondary | ICD-10-CM

## 2014-07-25 DIAGNOSIS — Z3493 Encounter for supervision of normal pregnancy, unspecified, third trimester: Secondary | ICD-10-CM | POA: Diagnosis present

## 2014-07-25 DIAGNOSIS — O99213 Obesity complicating pregnancy, third trimester: Secondary | ICD-10-CM

## 2014-07-25 DIAGNOSIS — O99212 Obesity complicating pregnancy, second trimester: Secondary | ICD-10-CM

## 2014-07-25 LAB — CBC
HCT: 37.4 % (ref 36.0–46.0)
Hemoglobin: 12.8 g/dL (ref 12.0–15.0)
MCH: 30.5 pg (ref 26.0–34.0)
MCHC: 34.2 g/dL (ref 30.0–36.0)
MCV: 89.3 fL (ref 78.0–100.0)
MPV: 10.9 fL (ref 8.6–12.4)
PLATELETS: 373 10*3/uL (ref 150–400)
RBC: 4.19 MIL/uL (ref 3.87–5.11)
RDW: 13.4 % (ref 11.5–15.5)
WBC: 11.1 10*3/uL — ABNORMAL HIGH (ref 4.0–10.5)

## 2014-07-25 LAB — POCT URINALYSIS DIP (DEVICE)
BILIRUBIN URINE: NEGATIVE
Glucose, UA: NEGATIVE mg/dL
HGB URINE DIPSTICK: NEGATIVE
Ketones, ur: NEGATIVE mg/dL
NITRITE: NEGATIVE
Protein, ur: 30 mg/dL — AB
SPECIFIC GRAVITY, URINE: 1.02 (ref 1.005–1.030)
Urobilinogen, UA: 0.2 mg/dL (ref 0.0–1.0)
pH: 6 (ref 5.0–8.0)

## 2014-07-25 MED ORDER — PRENATAL VITAMINS 0.8 MG PO TABS: 1.0000 | ORAL_TABLET | Freq: Every day | ORAL | Status: DC

## 2014-07-25 MED ORDER — TETANUS-DIPHTH-ACELL PERTUSSIS 5-2.5-18.5 LF-MCG/0.5 IM SUSP
0.5000 mL | Freq: Once | INTRAMUSCULAR | Status: AC
  Administered 2014-07-25: 0.5 mL via INTRAMUSCULAR

## 2014-07-25 NOTE — Progress Notes (Signed)
Subjective:  Kelli Russell is a 29 y.o. G4P1021 at 5755w1d being seen today for ongoing prenatal care.  Patient reports backache.  Contractions: Not present.  Vag. Bleeding: None. Movement: Present. Denies leaking of fluid.   The following portions of the patient's history were reviewed and updated as appropriate: allergies, current medications, past family history, past medical history, past social history, past surgical history and problem list.   Objective:   Filed Vitals:   07/25/14 0854  BP: 114/80  Pulse: 108  Temp: 98.4 F (36.9 C)  Weight: 285 lb 3.2 oz (129.366 kg)    Fetal Status: Fetal Heart Rate (bpm): 145   Movement: Present     General:  Alert, oriented and cooperative. Patient is in no acute distress.  Skin: Skin is warm and dry. No rash noted.   Cardiovascular: Normal heart rate noted  Respiratory: Normal respiratory effort, no problems with respiration noted  Abdomen: Soft, gravid, appropriate for gestational age. Pain/Pressure: Present     Vaginal: Vag. Bleeding: None.    Vag D/C Character: White  Extremities: Normal range of motion.  Edema: None  Mental Status: Normal mood and affect. Normal behavior. Normal judgment and thought content.   Urinalysis: Urine Protein: 1+ Urine Glucose: Negative  Assessment and Plan:  Pregnancy: G4P1021 at 3055w1d  1. Supervision of normal pregnancy, third trimester 28 wk labs today - Prenatal Multivit-Min-Fe-FA (PRENATAL VITAMINS) 0.8 MG tablet; Take 1 tablet by mouth daily.  Dispense: 30 tablet; Refill: 7 - RPR - CBC - HIV antibody (with reflex) - Glucose Tolerance, 1 HR (50g) - Tdap (BOOSTRIX) injection 0.5 mL; Inject 0.5 mLs into the muscle once.  Preterm labor symptoms and general obstetric precautions including but not limited to vaginal bleeding, contractions, leaking of fluid and fetal movement were reviewed in detail with the patient. Please refer to After Visit Summary for other counseling recommendations.  Return  in 2 weeks (on 08/08/2014) [facility]   Reva Boresanya S Griffey Nicasio, MD

## 2014-07-25 NOTE — Progress Notes (Signed)
[employer] interpreter [phone] Patient reports lower back pain Reviewed tip of week with patient

## 2014-07-25 NOTE — Patient Instructions (Addendum)
Saddexda Bilood Saddexaad ee [place] biloodlaha saddexaad waa ka toddobaadka 29 marayo todobaadka 42, bilood 7 illaa 9. bilood saddexaad waa waqti marka ilmaha caloosha ku waa si degdeg ah u koraya. Dhamaadkii bishii sagaalaad, ilmaha caloosha ku saabsan yahay 20 inches oo dherer ah iyo culeyskiisu 6-10 pounds. JIRKA ISBEDELKA Jirkaaga maraa isbedel badan Peter Minium leedahay. isbedel ayaa ku kala duwan yihiin dumar naagtii. Your miisaanka sii wadi doonaa in [place]. Waxaad filan kartaa in ay helaan 25-35 rodol (11-16 kg) dhammaadka uurka. Waxaad bilaabi kartaa in aad ka heli calaamadaha fiditaanka aad miskaha, caloosha, iyo naasaha. Waxaad kaadi laga yaabaa in marar badan sababta oo ah ilmaha caloosha ku waa hoose guurayso miskaha iyo riixaya on [place]. Waxaa kugu dhici karo ama ku wadaan inay leeyihiin laab natiijo ahaan aad uurka leedahay. Waxaad horumariyaan laga yaabaa in calooshu sababtoo ah hormoonada qaarkood sababaya muruqyada in hardiyi qashinka iyada oo mindhicirrada si aad hoos u dhigi. Waxaa kugu dhici karo babaasiirta ama barara, ferjiga xididdada (xididdada qaradhku). Waxaa laga yaabaa inaad xanuun miskaha sababta oo ah korodhka miisaanka iyo hormoonnada uurka nasto laabatooyinka dhexdooda lafaha miskaha ee aad. Dhabar ku keeni karta culaysdhaaf muruqyada kaabaya qaab. laga yaabaa in aad isbeddel ah ee timahaaga. Tacy Learn waxaa ka mid [place] dhagid timahaaga, koritaanka saa'idka ah, iyo isbedelada ka sameysan. [place] qaarkood waxay kaloo leeyihiin [employer] lagu guda jiro ama [place], ama timaha in uu dareensan yahay qalalan ama khafiif ah ka dib. Your timaha u badan tahay in si caadi ah ku soo laaban doonaa ka dib marka ilmahaagu dhasho. Naasahaaga sii wadi doontaa in ay koraan iyo damqasho. dheecaan A huruud ah waxaa laga yaabaa in daadato naasaha aad loo yaqaan dambar. Xudunta calooshaa way soo baxdaa. Waxaa laga yaabaa in aad dareento in neeftu kugu gaaban sababtoo ah  ilmo-ballaarinta. Waxaad arki kartaa "go'in," uurjiifka ama dhaqaaqin hoose ee caloosha. Waxaa laga yaabaa in dheecaan ka xab dhiig leh. Murrell Redden waxay sida caadiga ah dhacdaa dhowr maalmood si ay hal usbuuc ka hor shaqada bilowdey. Your ilmo galeenka noqdo khafiif ah oo jilicsan (effaced) dhow taariikhda sabab. Maxaa laga Kandy [facility] AAD Waxaad heli doontaa imtixaanka [place] 2 todobaad kasta ilaa toddobaadka 36. Markaas, waxaad yeelan doontaa imtixaanka uurka todobaadle ah. Intii lagu guda jiray booqasho dhalmada caadiga ah: la miisaamay doonaa in aad si loo hubiyo in [place] caloosha ku sida caadiga ah u koraya. Cadaadiska dhiiggaaga ayaa la qaaday. Your caloosha la qiyaasi doonaa si ay ula socdaan koritaanka ilmahaaga. garaaca wadnaha ayaa uurjiifka la dhegaysan doonaa in. Natiijada imtixaanka kasta oo ka booqashada hore laga wada [employer]. Waxaa laga yaabaa in afka ilmo-galeenka jeeg dhow taariikhda aad sabab u arko haddii aad effaced. Ugu ku dhawaad ??36 toddobaad, daryeelahaagu wuxuu hubin [employer]. Isla mar ahaantaana, aad daryeel sidoo kale samayn doonaa baaritaan ku saabsan dheecaanka of unugyada taranka. Baaritaankaan waa in la go'aamiyo haddii nooc ka mid ah bakteeriyada, Group B streptococcus, waa hadiyad. Daryeelahaagu this sharxi doona dheeraad ah. Daryeelahaagu ku weydiin karaan inaad: Waa maxay qorshaha aad ku dhalatay waa. Sidee aad dareensan tahay. Haddii aad dareensan tahay dhaqaaqo ilmaha. Haddii aad wax calaamado aan caadi ahayn, sida dareere, dhiig, madax xanuun daran, ama casiraad caloosha dusaya. Haddii aad qabto wax su'aalo ah. baaritaano kale ama baaritaanka in la sameeyo waxaa laga yaabaa in [place] aad bilood saddexaad ka mid ah: Tijaabooyinka dhiiga in lagu hubiyo heerka birta ku yar (anemia). imtixaanka Uurjiifka si loo hubiyo caafimaadka, heerka [place], iyo [place] uurjiifka. Baaritaanka waxaa loo sameeyaa haddii aad  leedahay xaalado caafimaad oo  gaar ah ama haddii ay jiraan dhibaatooyin [place] lagu [place]. SHAQAALAHA BEEN Waxaad dareemi kartaa yar, foosha aan joogto ahayn in [place] danbeyn ay iska tagaan. Devonne Doughty waxa loo yaqaan Deberah Pelton, ama shaqada been ah. Fool-socon Kennedy Bucker, maalmo, ama xataa todobaad ka hor nooc shaqada run. Haddii [place] xanuunka foosha ku timaado si joogto ah, oo xoojinaya, ama xanuun, waxaa fiican in la arko in aad daryeel. Calaamadaha Foosha Caadada u eg [place]. Fool-in ay yihiin dhexeyso 5 daqiiqadood ama ka yar. Fool in bilaabi on sare ee ilmo-galeenka iyo faafin hoos caloosha hoose iyo dib. Dareen cadaadiska miskaha kordheen ama dhabar xanuunka. A dheecaan biyood ama ku daatay xab ka timaada siilka. Haddii aad qabto wax ka mid ah calaamadahaan ka hor toddobaadka 37aad ee [place], wac daryeel isla Darliss Ridgel. Waxaad u baahan tahay si ay u tagaan isbitaalka in degdeg ah loo hubiyaa. [facility] Ka fogow cabidda sigaarka oo dhan, iyo geedo yaryar, [place], iyo daroogada unprescribed. kiimikada [place] waxay saamayn ku formation iyo koritaanka ilmaha. Raac tilmaamaha aad daryeel ee ku saabsan isticmaalka daawada. Waxa jira dawooyin waa mid ammaan ah ama ammaan ahayn in la [place] lagu guda jiro [place]. Jimicsiga oo kaliya sida uu kuu faray daryeel. Fahmida casiraad minka waa calaamad wanaagsan in la jooji jimicsiga. Continue in la cuno si joogto ah, cunto caafimaad leh. Xiro rajabeeto a taageero fiican u danqasho naasaha. Ha isticmaalin tubbo biyo kulul, uumi [place], [place]. Gasho aad suunka kursiga waqtiyada oo dhan markii baabuur-wadidda. Iska ilaali hilibka cayriin, cheese ceedhin, sanaadiiqda saxarada bisadda, oo ciidda loo isticmaalo by bisadaha. Kuwani qaado jeermiska keeni kara cilladaha lagu dhasho ee ilmaha. Qaado fiitamiinnada Selinda Flavin [place]. Isku day inaad qaadato saxaro jilciye (haddii aad daryeel-mariyay) haddii aad dareento caloosha.  Cun cuntooyin ay buunshuhu ku sarreeyaan-dheeraad ah, sida khudaar daray ah ama miro iyo miraha isu dhan. Cab cabitaano badan si aad kaadida cad ama jaalle ah oo caddaado. Qaado bad diiran [place] oo dejiyo xanuun ah ama lur [place] babaasiirta. Isticmaal labeen hemorrhoid haddii daryeel-mariyay. Haddii aad yeeshaan xididada qaradhku, xiran tuubo taageero. Sare aad cagaha ilaa 15 daqiiqadood, 3-4 jeer maalintii. Yaree milixda in aad cunto. Iska ilaali qaadista wax culus, xidho kabo hooseeyo bogsiiyo, oo ku dhaqmaan qaab fiicanna. Saar badan iyada oo lugahaagu Qolol haddii aad qabtid casiraad ama dhabar xanuunka hooseeyo. Booqo dhakhtarkaaga ilkaha haddii aad [place] leedahay. Isticmaal caday jilicsan si aad ilkaha iyo tur marka aad nadiifin. Alfredo Bach A galmada la sii wadi laga yaabaa in haddii aad daryeelaha haddii kale idiin Darda[place]i. Ha u safraan masaafo fog haddii ay tahay lagama maarmaan iyo kaliya la ansixiyo aad daryeel. Qaado fasallada dhalmada ka hor si ay u fahmaan, dhaqanka, iyo weydii su'aalo ku saabsan foosha iyo dhalmada. Samee tijaabo ah u ordaan isbitaalka. Diyaarso bac ay isbitaalka. Diyaari xanaanada ilmaha. Sii wad in aad dhammaan booqashooyinka aad uurka sida aad daryeel faray. Raadso daryeel caafimaad haddii: Waxaad hubin haddii aad ku sugan tahay shaqada ama hadday Biyuhu wuu jebiyey. Waxaad wareer. Waxaad xanuun [place] ah miskaha, cadaadiska [place], ama xanuun kabashadiisa degaanka caloosha. Waxaad lallabo joogto ah, matag, ama shuban. Waxaad dheecaan xun oo qarmuun siilka. Waxaad xanuun [employer]. RAADSO daryeel caafimaad oo degdeg HADDII: Waxaad qandho. Waxaad dusaya dheecaan siilkaaga. Waxaad dhiig ama dhiig hoostaada ka. Waxaad casiraad caloosha daran ama xanuun. Waxaad miisaan degdeg ah khasaaro ama faa'iido. Waxaad neefta oo ku qabata xanuunka xabadka. Inaad dareentid barar lama filaan ah ama xad dhaaf ah wejigaaga, gacmaha, kuraamaha, cagaha,  ama lugaha. Waxaad aan dareemay ilmahaaga guurto in  ka badan saacad. Waxaad madax xanuun daran oo ha iska daawo tagaan. Waxaad araggaagu isbeddelo. Document Released: 12/22/2000 Document Revised: 01/02/2013 Document Dib: 02/29/2012 ExitCare Information [place]  2015 [place], [place]. Macluumaadkan looguma talagalin inay talo siin aad ka hesho daryeel bixiyaha caafimaadka. Hubi inaad kala hadashaa wixii su'aalo aad qabto la bixiyaha xanaanada caafimaadkaaga.  Naas-nuujinta Go'aansiga naaska waa mid ka mid ah xulashada ugu fiican ee aad samayn kartaa adiga iyo ilmahaaga. Isbeddel ku hormoonnada [place] sababa unugyada naaska si aad u koraan iyo kordhiyaa tirada iyo size of tuubada caanaha aad. hormoonnada waxa kale oo u oggolaan borotiinada, sonkorta, iyo dufan ka qulqulka dhiigga in la sameeyo caanaha naaska ee qanjidhada caanaha soo saara. Hormoonnada hortago caanaha naaska ka la sii daayey ka hor inta ilmahaagu dhasho iyo sidoo kale socodka caanaha isla markiiba dhalashada ka dib. Marka naasnuujinta ayaa bilaabay, waana fikirro ilmahaaga, iyo sidoo kale isaga ama iyada caanonuug ama qaylinaya, kicin kara in la sii daayo oo caano ah ka qanjirada caanaha soo saara. [place], Your Baby caanaha Your ugu horeysay (dambar) ka caawisaa habka shaqo dheefshiidka ilmahaaga wanaagsan. Waxaa jira antibodies in caanaha aad ka caawiya ilmahaaga dagaalamo cudurada. Ilmahaagu waxa uu leeyahay dhacdooyinka a hoose ee neefta, xasaasiyad, iyo lama filaan ah dhimashada dhallaanka syndrome. The nafaqooyinka caanaha naaska waa wanaagsan ee ilmahaaga ka qaaciidooyinka dhallaanka iyo waxaa si gaar ah loogu talagalay baahida ilmahaaga. Caanaha naaska hagaajinaysaa [employer]. Ilmahaagu waa yar tahay in ay yeeshaan xaaladaha kale, sida cayilka carruurnimada, neefta, ama nooca 2 mellitus diabetes. Waayo, waxaad Naas-nuujinta waxay ka caawisaa in la abuuro bond aad u gaarka  ah ee u dhexeeya adiga iyo ilmahaaga. Naasnuujintu waa ku haboon. Caanaha naaska had iyo jeer waxaa laga heli karaa heerkulka saxda ah iyo [place]. Naas-nuujinta waxay ka caawisaa si ay u gubaan calories oo kaa caawinaysaa in aad lumiso miisaanka helay [place] lagu guda Francoise Ceo. Naas ka dhigaysa heshiiska ilmo galeenka si aad u size prepregnancy ay si dhakhso ah oo loogu talla dhiig (uub) ka dib umusha. Naas-nuujinta ayaa kaa yareyn kara halista nooca 2aad ee diabetes mellitus, osteoporosis, iyo naaska ama kansarka ugxansiduhu dambe ee nolosha. CALAAMADAHA IN [facility] WAA GAAJADA Calaamadaha Hore ee Gaajada feejignaanta la kordhiyo ama waxqabad. Iskala. Movement of madaxa dhinac ilaa dhinac. Movement of madaxa iyo furitaanka afka Luciana Axe of afka ama dhabanka waxaa stroked (afjarno). Kordhinta jaqaan dhawaaqa, dibnaha, muusiqada, taahid, ama xooga. Gacan-ka-afka [id number]. caanonuug korodha faraha ama gacmaha. Calaamadaha dambe ee Gaajada Fussing. Ka dhex qaylinaya. Ba'an Calaamadaha Gaajada Calaamadaha gaajo xad dhaaf ah u baahan doonaa dejinaaya oo u tacsiyaynaysay hor ilmahaaga awoodaan in ay si guul leh u naasnuujin noqon doonaa. Ha sugin calaamadaha soo socda gaajo xad dhaaf ah ay u dhacaan ka hor inta aadan la bilaabo naas-nuujinta: Degenaansho la'aanta. A cod weyn, qaylada xoogga leh. Qeyliyo. AASAAS NUUJINTA Naas Bilaabayo Raadi meel raaxo leh in fadhiiso ama jiifso, oo aad qoorta iyo dhabarka si fiican u taageeray. Dhig barkimo ama buste u [place] hoos ilmahaaga in ay isaga ama iyada ku soo dejin heerka [place]aska (haddii aad ka fadhiisanayso). barkimo Nursing waxaa si gaar ah loogu talagalay in lagu caawiyo gacmahaaga iyo ilmahaaga taageero [employer]. Hubi in caloosha ilmaha waxaa soo food saartay calooshaada. Qunyar salaax naasahaaga. Iyadoo farahaaga, massage ka derbiga xabadka aad xagga ibta dhaqaaq wareeg ah. Tani waxay dhiiri socodka caanaha. Waxaa laga  yaabaa inaad u baahan tahay in ay sii wadaan tallaabadan lagu jiro quudinta haddii caanaha si tartiib ah u baxayaan. Taageer naaska la 4 faraha hoose iyo suulka  aad kor ku ibta. Hubi farahaaga yihiin iyo sidoo kale ka ibta naaska oo afka ilmahaaga iska. Stroke bushimaha ilmahaaga si tartiib ah oo aad farta ama ibta. Marka ilmahaaga afkiisu waa u furan ballaaran ku filan, si deg deg ah si aad u soo qaado ilmahaaga naaska, gelinayn ibta oo idil iyo sida badan ee aagga midab agagaarka ibta (ibta) intii suurto gal ah ee ilmahaaga afkayga ma gelin. ibta More waa in ay la arki karo ka sareeyaan dibinta sare ee ilmahaaga ka hoos dibinta hoose. carrabka ilmahaagu waa inuu u dhexeeyo ama xanjo iyada hoose iyo naaska. Hubi in ilmahaaga afkiisa si sax ah u taagan hareeraha ibta (shabaqa). bushimihiisa ilmahaaga waa in la abuuro seal a on naaska iyo dibadda loo soo jeestay (everted). Waa caadi in ilmahaaga nuugo oo ku saabsan 2-3 daqiiqo si ay u bilaabaan socodka caanaha [place]aska. Johney [place] Baridda ilmahaaga sida loo qabsado on inay naaska si sax ah waa mid aad u muhiim ah. xire An khaldan waxay Lanetta Inch xanuun ibta oo hoos u siin caanaha aad iyo miisaanka masaakiinta faa'iido ee ilmahaaga. Sidoo kale, haddii [employer] rogtid oo aad ibta [place]aska si sax ah, isaga ama iyada waxaa laga yaabaa in la gooyo qaar ka mid ah hawada lagu jiro quudinta. Murrell Redden waxay ka dhigi kartaa ilmahaagu ooyo. Daacsiinta ilmahaaga marka aad beddelaan naasahaaga inta lagu jiro quudinta ayaa kaa caawin kara in laga takhaluso hawada. Si kastaba ha ahaatee, wax ku barayay in ilmahaagu qabsado on si sax ah weli waa habka ugu fiican si looga hortago jeclayn ka liqidda hawada halka naas nuujinta. Calaamadaha in ilmahaaga ayaa si guul leh shabaqa on inay ibta: tugging Silent ama caanonuug aamusa, oo aan aad u [place] xanuun. Liqidda [employer] u dhaxaysa 3-4 Rogue Jury. dhaqdhaqaaqa Murqo kor ku xusan oo hore ee isaga ama iyada  dhegaha halka nuugo. Calaamadaha in ilmahaaga ayaa si guul ah ma shabaqa on in ibta [place]aska: Nuugista dhawaaqa ama dilin dhawaaqyada ka soo ilmahaaga halka naas nuujinta. xanuunka ibta. Haddii aad u malaynayso in [place] uusan shabaqa ku sax, simbiriirixan fartaada galay geeska of afka ilmahaaga in ay jebiyaan nuugid iyo meel u dhexaysa ciridka ilmahaaga. -Dayga bilaabidda naasnuujinta mar kale. Calaamadaha Naas Successful Calaamadaha ka ilmahaaga: hoos u dhac A tartiib ah in [place] ama joojin buuxda oo caanonuug. Hoos hurda. [place] of isaga ama [place]. Haynta ee qadar yar oo ah caano ee afkiisa ama afkeeda. Ogolaato go naaska aad asaga ama ayada by. Calaamadaha aad ka: Naasaha in ayaa ku soo kordhay ee suga, miisaanka, iyo size 1-3 saacadood ka dib quudinta. Naasaha kuwa jilicsan isla markiiba ka dib naasnuujinta. mugga caanaha la kordhiyo, sidoo kale isbedel ku joogta caano iyo midab by maalinteedii shanaad naas-nuujinta. Ibta oo aan xanuun, dillaac, ama dhiig-bax. Calaamadaha Taasi Your Baby Helay Caano Ku Filan Qoynta ugu yaraan 3 xafaayadda in muddo 24-saacadood ah. kaadida wuxuu noqon waa cad iyo jaalle midab by da'da 5 maalmood. Ugu yaraan 3 saxaro in muddo 24-saac ah oo ay 5 maalmood da'da. saxarada waa in uu ahaadaa jilicsan iyo huruud ah. Ugu yaraan 3 saxaro in muddo 24-saac ah oo ay muddo 7 maalmood da'da. saxarada waa in uu ahaadaa iniinyaha iyo huruud ah. No khasaare ah oo miisaankiisu ka badan 10% ee miisaanka dhalashada weyn inta lagu guda jiro 3 maalmood ee ugu horreeya da'da. Celceliska korodh miisaan oo ah 4-7 ounces (113-198 g) toddobaadkii da'da 4 maalmood ka dib. miisaanka Joogtaynta faa'iido maalin kasta by 5 maalmood da'da, oo aan miisaan lumis ka dib da'da 2 todobaad. Ka dib markii  quudinta ah, ilmahaaga ku tufi kartaa ilaa qadar yar. Tani waa caadi. Miles Costain jeer naas-nuujinta iyo DURATION quudinta badan kaa caawin doona inaad ka dhigi caano badan oo ka  hortagi kara ibta xanuun iyo xidhitaanka [place]aska. Naas marka aad dareento baahida loo qabo in la yareeyo tirada buuxda oo aad naasaha ama marka ilmahaagu wuxuu muujinayaa calaamadaha gaajada. Tan waxa loo yaqaan "naas-nuujinta ku saabsan baahida loo qabo." Iska ilaali bandhigid mujuruqa in [place] aad ka shaqeeya in la dhiso naas-nuujinta (ee 4-6 wiig ee ugu horeeya ka dib marka ilmahaagu dhasho). Ka dib wakhtigan waxaad dooran kartaa in aad isticmaasho mujuruqa. Cilmi baaris ayaa muujisay isticmaalka in [place] lagu guda jiro sanadka ugu horeeya nolosha ilmaha hoos u halista ah ee lama filaan ah dhimashada dhallaanka syndrome (SIDS). U ogolow ilmahaaga in ay ku quudiyaan on naas kasta ilaa iyo inta uu rabo. Naas ilaa ilmahaaga la dhammeeyo quudinta. [facility] ilmahaaga unlatches yahay ama u dhacaa hurda halka quudinta [place]aska ka horeysay, [place]aska labaad. Maxaa yeelay dhalatay inta badan waa hurdo in dhowrka toddobaad ee ugu horreeyay ee nolosha, waxaa laga yaabaa in aad u baahan tahay si ay u toosin ilmahaaga si aad u hesho isaga ama iyada in ay ku quudiyaan. jeer Naas kala duwanaan doonaa ilmahaaga si ilmahaaga. Si kastaba ha ahaatee, xeerarka soo socda shaqayn karo, isagoo ah hanuuniye si ay Wellsite geologist in aad hubiso in ilmahaagu si fiican quudin: Dhallaanka (ilmaha 4 todobaad jir ama ka yar) naaska laga yaabaa in 1-3 saacadood kasta saacadood [place]. Dhallaanka waa in [place] tago muddo ka badan 3 saacadood [place] lagu jiro [place] ama 5 saacadood habeenkii aan naas-nuujinta. Waa in aad naaska ilmahaaga ugu yaraan 8 jeer muddo 24-saacadood ah ilaa aad ka bilaabaan inay soo bandhigo cuntada adag in ilmahaaga ilaa da'da 6 bilood. Listo caanaha naaska Lisida iyo kaydinta caanaha naaska kuu ogolaanaya in aad si loo hubiyo in ilmahaagu si gaar quudin caanaha [place], iyo xataa waqtiyada marka aanad awoodin inaad naaska. Tani waxay si gaar ah muhiim, haddii aad dib u shaqeyn doonaa [place] aad weli naaska ama  marka aadan inuu joogo [place] lagu guda jiro [place]. taliye Your nuujinta ku siin karaan tilmaamaha ku saabsan sida dheer ay nabdoon tahay in [place]. lisa naaska A waa mashiin kuu ogolaanaya inaad lisidda caanaha naaska ka galay dhalo nadiif ah. The caanaha naaska gareeyeen ka dibna lagu Lenord Fellers qaboojiyaha ama qaboojiyaha. bambooyin naaska Qaar ka mid ah waxaa ka shaqeeya oo uu gacanteeda qabtay, halka qaar kalena ay u isticmaalaan korontada. Weydii la taliyaha aad nuujinta nooca shaqayn doona adiga kuu fiican. bambooyin naaska laga Sudie Grumbling, laakiin qaar ka mid ah isbitaalada iyo kooxaha taageerada naasnuujinta kiraysan bambooyin naaska ee ku salysan bishiiba. taliye nuujinta A idin bari karaa sida loo dhiibi caanaha [place] express, haddii aad jeceshahay in aadan isticmaalin bamka ah. Curley Spice AAD naaska Ibta noqon kartaa qalalan, dillaac, iyo xanuun halka naaska. The talooyinka soo socda ayaa kaa caawin kara in aad naasaha sii moisturized iyo caafimaad: Iska ilaali isticmaalaya saabuun ibta. Xiro rajabeeto taageero. [address] oo aan loo [place], [place] xirashada kalkaalinta iyo haanta gaar ah waxaa loogu talagalay in ay oggolaadaan in naasahaaga waayo, naas-nuujinta oo aan qaadan off aad rajabeeto dhan ama sare. Iska ilaali xiran xirashada underwire-style ama xirashada aad u dhagan. Air qallaji ibahaaga 3-4 daqiiqo ka dib markii [place]. Isticmaal pads rajabeeto suuf oo kaliya si uu u nuugo caanaha naaska xaday. Will Bonnet caanaha naaska u dhexeysa quudinta waa caadi. Isticmaal lanolin ibta ka dib naasnuujinta. Lanolin caawiyaa si ay  u ilaaliyaan qoyaanka caqabad caadiga ah maqaarka ee. Haddii aad isticmaasho lanolin saafi ah, uma baahnid inaad u baahan tahay inaad maydho off ka hor mar quudinta ilmahaaga. lanolin daahir ma aha sun ah si ay ilmahaaga. Waxa kale oo laga yaabaa in aad gacanta ku muujiyaan dhawr dhibcood oo caanaha naaska iyo si  tartiib ah duugin caano gaadhay ibta aad iyo u ogolaan caanaha si qalalan hawada. In dhowrkii toddobaad ee dhalmada ka dib marka hore, haweenka qaarkood dareemaan Laabta aad u buuxa [phone]). Darar Dedra Skeens dhigi kartaa naasahaaga dareemaan culus, diiran, oo dhaylo ah in la taabto. danbow fooca 3-5 maalmood gudahood ka dib markii aad dhasho. The talooyinka soo socda ayaa kaa caawin kara xidhitaanka istareexsan: Gebi madhin naasahaaga halka naaska ama ku listo. laga yaabaa in aad rabto in aad bilowdo by codsanaya kulaylka diiran, qoyan (qubayska ama la shukumaano gacanta biyo-qooyay diiran) wax yar ka hor quudinta ama ku listo. Murrell Redden waxay Augusto Gamble wareegga iyo waxay Dedra Skeens caawisaa socodka caanaha. Haddii [place]mahaagu aanu si buuxda naasahaaga madhin halka naas nuujinta, lisidda kasta oo caano dheeraad ah ka dib markii isaga ama iyada la dhammeeyo. Evie Lacks rajabeeto a Audiological scientist (nursing ama joogto ah) ama taangiga top 1-2 maalmood in ay calaamad u ah jirkaaga si yar hoos u-soo-saarka caanaha. Codso baraf si aad naasaha, haddii tani ay tahay mid aad u raaxo aad u. Hubi in [place] shabaqa on oo si fiican u taagan halka naas nuujinta. Haddii xidhitaanka socoto 48 saac ee soo socda talooyin ka dib, la xiriir bixiyahaaga daryeelka caafimaadka ama la taliyaha nuujinta ah. SARAYSA TALOOYINKA DARYEELKA CAAFIMAADKA inta aad naaska nuujineyso Cun cuntooyin caafimaad leh. Gweneth Dimitri u dhaxaysa [place] cuntada iyo cuntada fudud, [place] 3 of [place]. Sababtoo ah waxa aad cuni saameeyaa caanaha naaska aad, qaar ka mid ah cuntooyinka dhallaankaaga ka dhigi karaan in ka badan xanaaq badan intii caadiga ahayd. Ka fogow cunista cuntooyinka haddii aad hubto in ay si xun u saamaynaya dhallaankaaga. Cab caanaha, casiirka furutada, iyo biyo si aad u qanciso harraad (10 koob maalintii). inta badan naso, nasasho, oo ay sii wadaan in ay qaataan vitamins aad dhalmada si looga hortago daal, stress, iyo dhiig la'aan. Continue  jeegaga is-wacyiga naaska. Iska ilaali qaadka iyo sigaarka sigaarka. Iska ilaali isticmaalka daroogada iyo khamriga. Daawooyinka qaarkood laga yaabo in waxyeello u leh ilmaha u gudbin [place]. Waxaa muhiim ah inaad weydiiso bixiyaha xanaanada caafimaadkaaga ka hor inta aysan wax daawo ah, oo ay ku jiraan oo dhan-the-counter iyo dawooyinka la qoro iyo sidoo kale vitamin iyo supplements dhirta. Waxaa suurto gal ah in aad uur yeelato halka naas nuujinta. Haddii [employer] [place], weydii bixiyaha xanaanada caafimaadkaaga ku saabsan fursadaha in uu noqon doono ammaan u ah ilmahaaga. Raadso daryeel caafimaad haddii: Waxaad dareemi sida aad rabto in aad joojiso naas nuujinta ama u noqday careysan naas-nuujinta. Waxaad naasaha oo ku xanuuna ama ibta. ibta waxaa dillaac ama dhiig-bax. Naasahaaga waa guduudan yahay, waana tabar daranyahay, ama diiran. Waxaad meel bararsan labada naasaha. Waxaad qandho ama qar-qaryo. Waxaad lalabbo ama matagid. Waxaad dheecaan kale oo aan ahayn caanaha naaska ka ibta. Naasahaaga ma noqon ka hor quudinta buuxa by [employer] dib umusha. Waxaad dareemeysaa murugo iyo niyad-jab. [place] waa mid aad u hurdo si fiican u cunaan. [place] waa dhib hurdada. [place] waa qoynta ka yar 3 xafaayadda in muddo 24-saacadood ah. [place] waxa uu leeyahay in ka yar 3 saxaro in muddo 24-saacadood ah. Maqaarka ilmahaaga ama qaybta cad ee indhaha isaga ama iyada uu noqonayaa huruud ah. [place] aan miisaan by 5 maalmood da'da. RAADSO  daryeel caafimaad oo degdeg HADDII: [place] waa dhigin mid daal (itaal darnaadaa) oo uusan rabin ilaa toosin iyo quudiyo. [place] waxa uu yeeshay qandho ah oo aan la sharrixin. Document Released: 12/28/2004 Document Revised: 01/02/2013 Document Dib: 06/21/2012 ExitCare Information [place]  2015 [place]. Macluumaadkan looguma talagalin inay talo siin aad ka hesho daryeel bixiyaha caafimaadka. Hubi inaad  kala hadashaa wixii su'aalo aad qabto la bixiyaha xanaanada caafimaadkaaga.

## 2014-07-26 LAB — GLUCOSE TOLERANCE, 1 HOUR (50G) W/O FASTING: Glucose, 1 Hour GTT: 128 mg/dL (ref 70–140)

## 2014-07-26 LAB — HIV ANTIBODY (ROUTINE TESTING W REFLEX): HIV: NONREACTIVE

## 2014-07-26 LAB — RPR

## 2014-08-07 ENCOUNTER — Ambulatory Visit (INDEPENDENT_AMBULATORY_CARE_PROVIDER_SITE_OTHER): Payer: Medicaid Other | Admitting: Certified Nurse Midwife

## 2014-08-07 VITALS — BP 127/61 | HR 114 | Temp 98.3°F | Wt 289.9 lb

## 2014-08-07 DIAGNOSIS — Z3493 Encounter for supervision of normal pregnancy, unspecified, third trimester: Secondary | ICD-10-CM

## 2014-08-07 LAB — POCT URINALYSIS DIP (DEVICE)
Bilirubin Urine: NEGATIVE
Glucose, UA: NEGATIVE mg/dL
Hgb urine dipstick: NEGATIVE
Ketones, ur: NEGATIVE mg/dL
Nitrite: NEGATIVE
Protein, ur: 30 mg/dL — AB
Specific Gravity, Urine: 1.025 (ref 1.005–1.030)
Urobilinogen, UA: 1 mg/dL (ref 0.0–1.0)
pH: 6 (ref 5.0–8.0)

## 2014-08-07 NOTE — Progress Notes (Signed)
Reviewed tip of week with patient Pacific interpreter [phone]

## 2014-08-07 NOTE — Progress Notes (Signed)
Subjective:  Kelli Russell is a 29 y.o. G4P1021 at [redacted]w[redacted]d being seen today for ongoing prenatal care.  Patient reports no complaints.  Contractions: Not present.  Vag. Bleeding: None. Movement: Present. Denies leaking of fluid.   The following portions of the patient's history were reviewed and updated as appropriate: allergies, current medications, past family history, past medical history, past social history, past surgical history and problem list.   Objective:   Filed Vitals:   08/07/14 0942  BP: 127/61  Pulse: 114  Temp: 98.3 F (36.8 C)  Weight: 289 lb 14.2 oz (131.493 kg)    Fetal Status: Fetal Heart Rate (bpm): 155   Movement: Present     General:  Alert, oriented and cooperative. Patient is in no acute distress.  Skin: Skin is warm and dry. No rash noted.   Cardiovascular: Normal heart rate noted  Respiratory: Normal respiratory effort, no problems with respiration noted  Abdomen: Soft, gravid, appropriate for gestational age. Pain/Pressure: Present     Vaginal: Vag. Bleeding: None.       Cervix: Not evaluated        Extremities: Normal range of motion.  Edema: None  Mental Status: Normal mood and affect. Normal behavior. Normal judgment and thought content.   Urinalysis: Urine Protein: 1+ Urine Glucose: Negative  Assessment and Plan:  Pregnancy: G4P1021 at [redacted]w[redacted]d  1. Supervision of normal pregnancy, third trimester     Interpreter used     Reviewed 1 hour GTT results  Preterm labor symptoms and general obstetric precautions including but not limited to vaginal bleeding, contractions, leaking of fluid and fetal movement were reviewed in detail with the patient. Please refer to After Visit Summary for other counseling recommendations.  No Follow-up on file.  RTO 2 weeks Rhea Pink, CNM

## 2014-08-07 NOTE — Patient Instructions (Signed)
Third Trimester of Pregnancy The third trimester is from week 29 through week 42, months 7 through 9. The third trimester is a time when the fetus is growing rapidly. At the end of the ninth month, the fetus is about 20 inches in length and weighs 6-10 pounds.  BODY CHANGES Your body goes through many changes during pregnancy. The changes vary from woman to woman.   Your weight will continue to increase. You can expect to gain 25-35 pounds (11-16 kg) by the end of the pregnancy.  You may begin to get stretch marks on your hips, abdomen, and breasts.  You may urinate more often because the fetus is moving lower into your pelvis and pressing on your bladder.  You may develop or continue to have heartburn as a result of your pregnancy.  You may develop constipation because certain hormones are causing the muscles that push waste through your intestines to slow down.  You may develop hemorrhoids or swollen, bulging veins (varicose veins).  You may have pelvic pain because of the weight gain and pregnancy hormones relaxing your joints between the bones in your pelvis. Backaches may result from overexertion of the muscles supporting your posture.  You may have changes in your hair. These can include thickening of your hair, rapid growth, and changes in texture. Some women also have hair loss during or after pregnancy, or hair that feels dry or thin. Your hair will most likely return to normal after your baby is born.  Your breasts will continue to grow and be tender. A yellow discharge may leak from your breasts called colostrum.  Your belly button may stick out.  You may feel short of breath because of your expanding uterus.  You may notice the fetus "dropping," or moving lower in your abdomen.  You may have a bloody mucus discharge. This usually occurs a few days to a week before labor begins.  Your cervix becomes thin and soft (effaced) near your due date. WHAT TO EXPECT AT YOUR PRENATAL  EXAMS  You will have prenatal exams every 2 weeks until week 36. Then, you will have weekly prenatal exams. During a routine prenatal visit:  You will be weighed to make sure you and the fetus are growing normally.  Your blood pressure is taken.  Your abdomen will be measured to track your baby's growth.  The fetal heartbeat will be listened to.  Any test results from the previous visit will be discussed.  You may have a cervical check near your due date to see if you have effaced. At around 36 weeks, your caregiver will check your cervix. At the same time, your caregiver will also perform a test on the secretions of the vaginal tissue. This test is to determine if a type of bacteria, Group B streptococcus, is present. Your caregiver will explain this further. Your caregiver may ask you:  What your birth plan is.  How you are feeling.  If you are feeling the baby move.  If you have had any abnormal symptoms, such as leaking fluid, bleeding, severe headaches, or abdominal cramping.  If you have any questions. Other tests or screenings that may be performed during your third trimester include:  Blood tests that check for low iron levels (anemia).  Fetal testing to check the health, activity level, and growth of the fetus. Testing is done if you have certain medical conditions or if there are problems during the pregnancy. FALSE LABOR You may feel small, irregular contractions that   eventually go away. These are called Braxton Hicks contractions, or false labor. Contractions may last for hours, days, or even weeks before true labor sets in. If contractions come at regular intervals, intensify, or become painful, it is best to be seen by your caregiver.  SIGNS OF LABOR   Menstrual-like cramps.  Contractions that are 5 minutes apart or less.  Contractions that start on the top of the uterus and spread down to the lower abdomen and back.  A sense of increased pelvic pressure or back  pain.  A watery or bloody mucus discharge that comes from the vagina. If you have any of these signs before the 37th week of pregnancy, call your caregiver right away. You need to go to the hospital to get checked immediately. HOME CARE INSTRUCTIONS   Avoid all smoking, herbs, alcohol, and unprescribed drugs. These chemicals affect the formation and growth of the baby.  Follow your caregiver's instructions regarding medicine use. There are medicines that are either safe or unsafe to take during pregnancy.  Exercise only as directed by your caregiver. Experiencing uterine cramps is a good sign to stop exercising.  Continue to eat regular, healthy meals.  Wear a good support bra for breast tenderness.  Do not use hot tubs, steam rooms, or saunas.  Wear your seat belt at all times when driving.  Avoid raw meat, uncooked cheese, cat litter boxes, and soil used by cats. These carry germs that can cause birth defects in the baby.  Take your prenatal vitamins.  Try taking a stool softener (if your caregiver approves) if you develop constipation. Eat more high-fiber foods, such as fresh vegetables or fruit and whole grains. Drink plenty of fluids to keep your urine clear or pale yellow.  Take warm sitz baths to soothe any pain or discomfort caused by hemorrhoids. Use hemorrhoid cream if your caregiver approves.  If you develop varicose veins, wear support hose. Elevate your feet for 15 minutes, 3-4 times a day. Limit salt in your diet.  Avoid heavy lifting, wear low heal shoes, and practice good posture.  Rest a lot with your legs elevated if you have leg cramps or low back pain.  Visit your dentist if you have not gone during your pregnancy. Use a soft toothbrush to brush your teeth and be gentle when you floss.  A sexual relationship may be continued unless your caregiver directs you otherwise.  Do not travel far distances unless it is absolutely necessary and only with the approval  of your caregiver.  Take prenatal classes to understand, practice, and ask questions about the labor and delivery.  Make a trial run to the hospital.  Pack your hospital bag.  Prepare the baby's nursery.  Continue to go to all your prenatal visits as directed by your caregiver. SEEK MEDICAL CARE IF:  You are unsure if you are in labor or if your water has broken.  You have dizziness.  You have mild pelvic cramps, pelvic pressure, or nagging pain in your abdominal area.  You have persistent nausea, vomiting, or diarrhea.  You have a bad smelling vaginal discharge.  You have pain with urination. SEEK IMMEDIATE MEDICAL CARE IF:   You have a fever.  You are leaking fluid from your vagina.  You have spotting or bleeding from your vagina.  You have severe abdominal cramping or pain.  You have rapid weight loss or gain.  You have shortness of breath with chest pain.  You notice sudden or extreme swelling   of your face, hands, ankles, feet, or legs.  You have not felt your baby move in over an hour.  You have severe headaches that do not go away with medicine.  You have vision changes. Document Released: 12/22/2000 Document Revised: 01/02/2013 Document Reviewed: 02/29/2012 [employer] Patient Information 2015 [employer]. This information is not intended to replace advice given to you by your health care provider. Make sure you discuss any questions you have with your health care provider.  

## 2014-08-20 ENCOUNTER — Other Ambulatory Visit (HOSPITAL_COMMUNITY): Payer: Self-pay | Admitting: Family Medicine

## 2014-08-20 DIAGNOSIS — O99213 Obesity complicating pregnancy, third trimester: Secondary | ICD-10-CM

## 2014-08-20 DIAGNOSIS — Z3A32 32 weeks gestation of pregnancy: Secondary | ICD-10-CM

## 2014-08-21 ENCOUNTER — Encounter: Payer: Self-pay | Admitting: Advanced Practice Midwife

## 2014-08-22 ENCOUNTER — Encounter: Payer: Self-pay | Admitting: Family Medicine

## 2014-08-22 ENCOUNTER — Ambulatory Visit (INDEPENDENT_AMBULATORY_CARE_PROVIDER_SITE_OTHER): Payer: Medicaid Other | Admitting: Family Medicine

## 2014-08-22 ENCOUNTER — Ambulatory Visit (HOSPITAL_COMMUNITY)
Admission: RE | Admit: 2014-08-22 | Discharge: 2014-08-22 | Disposition: A | Discharge status: Home / Self Care | Payer: Medicaid Other | Source: Ambulatory Visit | Attending: Family Medicine | Admitting: Family Medicine

## 2014-08-22 VITALS — BP 121/76 | HR 98 | Temp 98.1°F | Wt 288.9 lb

## 2014-08-22 DIAGNOSIS — Z3A32 32 weeks gestation of pregnancy: Secondary | ICD-10-CM

## 2014-08-22 DIAGNOSIS — O99213 Obesity complicating pregnancy, third trimester: Secondary | ICD-10-CM

## 2014-08-22 DIAGNOSIS — IMO0001 Reserved for inherently not codable concepts without codable children: Secondary | ICD-10-CM

## 2014-08-22 DIAGNOSIS — O9989 Other specified diseases and conditions complicating pregnancy, childbirth and the puerperium: Secondary | ICD-10-CM

## 2014-08-22 DIAGNOSIS — Z3483 Encounter for supervision of other normal pregnancy, third trimester: Secondary | ICD-10-CM | POA: Diagnosis present

## 2014-08-22 DIAGNOSIS — Z789 Other specified health status: Secondary | ICD-10-CM | POA: Diagnosis not present

## 2014-08-22 DIAGNOSIS — Z3493 Encounter for supervision of normal pregnancy, unspecified, third trimester: Secondary | ICD-10-CM

## 2014-08-22 DIAGNOSIS — O99212 Obesity complicating pregnancy, second trimester: Secondary | ICD-10-CM

## 2014-08-22 DIAGNOSIS — R7611 Nonspecific reaction to tuberculin skin test without active tuberculosis: Secondary | ICD-10-CM

## 2014-08-22 LAB — POCT URINALYSIS DIP (DEVICE)
Glucose, UA: NEGATIVE mg/dL
HGB URINE DIPSTICK: NEGATIVE
Nitrite: NEGATIVE
PH: 5.5 (ref 5.0–8.0)
Protein, ur: 30 mg/dL — AB
Specific Gravity, Urine: 1.025 (ref 1.005–1.030)
Urobilinogen, UA: 1 mg/dL (ref 0.0–1.0)

## 2014-08-22 NOTE — Patient Instructions (Signed)
Third Trimester of Pregnancy The third trimester is from week 29 through week 42, months 7 through 9. The third trimester is a time when the fetus is growing rapidly. At the end of the ninth month, the fetus is about 20 inches in length and weighs 6-10 pounds.  BODY CHANGES Your body goes through many changes during pregnancy. The changes vary from woman to woman.   Your weight will continue to increase. You can expect to gain 25-35 pounds (11-16 kg) by the end of the pregnancy.  You may begin to get stretch marks on your hips, abdomen, and breasts.  You may urinate more often because the fetus is moving lower into your pelvis and pressing on your bladder.  You may develop or continue to have heartburn as a result of your pregnancy.  You may develop constipation because certain hormones are causing the muscles that push waste through your intestines to slow down.  You may develop hemorrhoids or swollen, bulging veins (varicose veins).  You may have pelvic pain because of the weight gain and pregnancy hormones relaxing your joints between the bones in your pelvis. Backaches may result from overexertion of the muscles supporting your posture.  You may have changes in your hair. These can include thickening of your hair, rapid growth, and changes in texture. Some women also have hair loss during or after pregnancy, or hair that feels dry or thin. Your hair will most likely return to normal after your baby is born.  Your breasts will continue to grow and be tender. A yellow discharge may leak from your breasts called colostrum.  Your belly button may stick out.  You may feel short of breath because of your expanding uterus.  You may notice the fetus "dropping," or moving lower in your abdomen.  You may have a bloody mucus discharge. This usually occurs a few days to a week before labor begins.  Your cervix becomes thin and soft (effaced) near your due date. WHAT TO EXPECT AT YOUR PRENATAL  EXAMS  You will have prenatal exams every 2 weeks until week 36. Then, you will have weekly prenatal exams. During a routine prenatal visit:  You will be weighed to make sure you and the fetus are growing normally.  Your blood pressure is taken.  Your abdomen will be measured to track your baby's growth.  The fetal heartbeat will be listened to.  Any test results from the previous visit will be discussed.  You may have a cervical check near your due date to see if you have effaced. At around 36 weeks, your caregiver will check your cervix. At the same time, your caregiver will also perform a test on the secretions of the vaginal tissue. This test is to determine if a type of bacteria, Group B streptococcus, is present. Your caregiver will explain this further. Your caregiver may ask you:  What your birth plan is.  How you are feeling.  If you are feeling the baby move.  If you have had any abnormal symptoms, such as leaking fluid, bleeding, severe headaches, or abdominal cramping.  If you have any questions. Other tests or screenings that may be performed during your third trimester include:  Blood tests that check for low iron levels (anemia).  Fetal testing to check the health, activity level, and growth of the fetus. Testing is done if you have certain medical conditions or if there are problems during the pregnancy. FALSE LABOR You may feel small, irregular contractions that   eventually go away. These are called Braxton Hicks contractions, or false labor. Contractions may last for hours, days, or even weeks before true labor sets in. If contractions come at regular intervals, intensify, or become painful, it is best to be seen by your caregiver.  SIGNS OF LABOR   Menstrual-like cramps.  Contractions that are 5 minutes apart or less.  Contractions that start on the top of the uterus and spread down to the lower abdomen and back.  A sense of increased pelvic pressure or back  pain.  A watery or bloody mucus discharge that comes from the vagina. If you have any of these signs before the 37th week of pregnancy, call your caregiver right away. You need to go to the hospital to get checked immediately. HOME CARE INSTRUCTIONS   Avoid all smoking, herbs, alcohol, and unprescribed drugs. These chemicals affect the formation and growth of the baby.  Follow your caregiver's instructions regarding medicine use. There are medicines that are either safe or unsafe to take during pregnancy.  Exercise only as directed by your caregiver. Experiencing uterine cramps is a good sign to stop exercising.  Continue to eat regular, healthy meals.  Wear a good support bra for breast tenderness.  Do not use hot tubs, steam rooms, or saunas.  Wear your seat belt at all times when driving.  Avoid raw meat, uncooked cheese, cat litter boxes, and soil used by cats. These carry germs that can cause birth defects in the baby.  Take your prenatal vitamins.  Try taking a stool softener (if your caregiver approves) if you develop constipation. Eat more high-fiber foods, such as fresh vegetables or fruit and whole grains. Drink plenty of fluids to keep your urine clear or pale yellow.  Take warm sitz baths to soothe any pain or discomfort caused by hemorrhoids. Use hemorrhoid cream if your caregiver approves.  If you develop varicose veins, wear support hose. Elevate your feet for 15 minutes, 3-4 times a day. Limit salt in your diet.  Avoid heavy lifting, wear low heal shoes, and practice good posture.  Rest a lot with your legs elevated if you have leg cramps or low back pain.  Visit your dentist if you have not gone during your pregnancy. Use a soft toothbrush to brush your teeth and be gentle when you floss.  A sexual relationship may be continued unless your caregiver directs you otherwise.  Do not travel far distances unless it is absolutely necessary and only with the approval  of your caregiver.  Take prenatal classes to understand, practice, and ask questions about the labor and delivery.  Make a trial run to the hospital.  Pack your hospital bag.  Prepare the baby's nursery.  Continue to go to all your prenatal visits as directed by your caregiver. SEEK MEDICAL CARE IF:  You are unsure if you are in labor or if your water has broken.  You have dizziness.  You have mild pelvic cramps, pelvic pressure, or nagging pain in your abdominal area.  You have persistent nausea, vomiting, or diarrhea.  You have a bad smelling vaginal discharge.  You have pain with urination. SEEK IMMEDIATE MEDICAL CARE IF:   You have a fever.  You are leaking fluid from your vagina.  You have spotting or bleeding from your vagina.  You have severe abdominal cramping or pain.  You have rapid weight loss or gain.  You have shortness of breath with chest pain.  You notice sudden or extreme swelling   of your face, hands, ankles, feet, or legs.  You have not felt your baby move in over an hour.  You have severe headaches that do not go away with medicine.  You have vision changes. Document Released: 12/22/2000 Document Revised: 01/02/2013 Document Reviewed: 02/29/2012 [employer] Patient Information 2015 [employer]. This information is not intended to replace advice given to you by your health care provider. Make sure you discuss any questions you have with your health care provider.  

## 2014-08-22 NOTE — Progress Notes (Signed)
[employer] Interpreter # [phone] Pain/pressure- lower abd, cramping  Educated on Rooming In

## 2014-08-22 NOTE — Progress Notes (Signed)
Subjective:  Kelli Russell is a 29 y.o. G4P1021 at [redacted]w[redacted]d being seen today for ongoing prenatal care.  Patient reports no complaints.  Contractions: Irritability.  Vag. Bleeding: None. Movement: Present. Denies leaking of fluid.   Lower abd pain/pressure- started at 5 month. Comes and goes. Will hurt for 2 days then goes away.  Report contractions during these times. Feels fetal movement.   The following portions of the patient's history were reviewed and updated as appropriate: allergies, current medications, past family history, past medical history, past social history, past surgical history and problem list.   Objective:   Filed Vitals:   08/22/14 1050  BP: 121/76  Pulse: 98  Temp: 98.1 F (36.7 C)  Weight: 288 lb 14.4 oz (131.044 kg)    Fetal Status: Fetal Heart Rate (bpm): 147   Movement: Present     General:  Alert, oriented and cooperative. Patient is in no acute distress.  Skin: Skin is warm and dry. No rash noted.   Cardiovascular: Normal heart rate noted  Respiratory: Normal respiratory effort, no problems with respiration noted  Abdomen: Soft, gravid, appropriate for gestational age. Pain/Pressure: Present     Pelvic: Vag. Bleeding: None     Cervical exam deferred        Extremities: Normal range of motion.  Edema: None  Mental Status: Normal mood and affect. Normal behavior. Normal judgment and thought content.   Urinalysis: Urine Protein: 1+ Urine Glucose: Negative  Assessment and Plan:  Pregnancy: G4P1021 at [redacted]w[redacted]d  1. Language barrier Used phone interpreter  2. Supervision of normal pregnancy, third trimester Updated overview Growth scan reviews, fetus in 78th Discussed pelvic pain/pressures as normal in third trimester. Recommended tylenol prn  3. Maternal morbid obesity, antepartum, second trimester 4. 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 Please refer to After Visit Summary for other counseling recommendations.  Return in about 2 weeks (around 09/05/2014) for routine PNC.   Federico Flake, MD

## 2014-09-05 ENCOUNTER — Ambulatory Visit (INDEPENDENT_AMBULATORY_CARE_PROVIDER_SITE_OTHER): Payer: Medicaid Other | Admitting: Family

## 2014-09-05 VITALS — BP 116/69 | HR 102 | Temp 98.4°F | Wt 290.8 lb

## 2014-09-05 DIAGNOSIS — Z3493 Encounter for supervision of normal pregnancy, unspecified, third trimester: Secondary | ICD-10-CM

## 2014-09-05 DIAGNOSIS — Z23 Encounter for immunization: Secondary | ICD-10-CM | POA: Diagnosis not present

## 2014-09-05 LAB — COMPREHENSIVE METABOLIC PANEL
ALBUMIN: 3.3 g/dL — AB (ref 3.6–5.1)
ALK PHOS: 107 U/L (ref 33–115)
ALT: 8 U/L (ref 6–29)
AST: 13 U/L (ref 10–30)
BUN: 4 mg/dL — ABNORMAL LOW (ref 7–25)
CO2: 21 mmol/L (ref 20–31)
Calcium: 9 mg/dL (ref 8.6–10.2)
Chloride: 103 mmol/L (ref 98–110)
Creat: 0.49 mg/dL — ABNORMAL LOW (ref 0.50–1.10)
GLUCOSE: 105 mg/dL — AB (ref 65–99)
Potassium: 4.2 mmol/L (ref 3.5–5.3)
Sodium: 137 mmol/L (ref 135–146)
TOTAL PROTEIN: 6.2 g/dL (ref 6.1–8.1)
Total Bilirubin: 0.3 mg/dL (ref 0.2–1.2)

## 2014-09-05 LAB — POCT URINALYSIS DIP (DEVICE)
GLUCOSE, UA: NEGATIVE mg/dL
Hgb urine dipstick: NEGATIVE
KETONES UR: NEGATIVE mg/dL
NITRITE: NEGATIVE
PH: 6 (ref 5.0–8.0)
Protein, ur: 30 mg/dL — AB
Specific Gravity, Urine: 1.025 (ref 1.005–1.030)
Urobilinogen, UA: 0.2 mg/dL (ref 0.0–1.0)

## 2014-09-05 NOTE — Progress Notes (Signed)
Subjective:  Kelli Russell is a 29 y.o. D1S9702 at 31w1dbeing seen today for ongoing prenatal care.  Patient reports backache.  Pain is upper back bilat.  No UTI symptoms.  Believes kidneys have been impacted by past OCP use.   Contractions: Not present.  Vag. Bleeding: None. Movement: Present. Denies leaking of fluid.   The following portions of the patient's history were reviewed and updated as appropriate: allergies, current medications, past family history, past medical history, past social history, past surgical history and problem list.   Objective:   Filed Vitals:   09/05/14 0936  BP: 116/69  Pulse: 102  Temp: 98.4 F (36.9 C)  Weight: 290 lb 12.8 oz (131.906 kg)    Fetal Status:   Fundal Height: 36 cm Movement: Present     General:  Alert, oriented and cooperative. Patient is in no acute distress.  Skin: Skin is warm and dry. No rash noted.   Cardiovascular: Normal heart rate noted  Respiratory: Normal respiratory effort, no problems with respiration noted  Abdomen: Soft, gravid, appropriate for gestational age. Pain/Pressure: Present     Pelvic: Vag. Bleeding: None     Cervical exam deferred        Extremities: Normal range of motion.  Edema: None  Mental Status: Normal mood and affect. Normal behavior. Normal judgment and thought content.   Urinalysis: Urine Protein: 1+ Urine Glucose: Negative  Assessment and Plan:  Pregnancy: G4P1021 at 344w1d1. Supervision of normal pregnancy, third trimester - Flu Vaccine QUAD 36+ mos IM  2. Back Pain - Comp Met (CMET) > to check kidney function; discussed use of Tylenol to alleviate pain  Preterm labor symptoms and general obstetric precautions including but not limited to vaginal bleeding, contractions, leaking of fluid and fetal movement were reviewed in detail with the patient. Please refer to After Visit Summary for other counseling recommendations.  Return in about 2 weeks (around 09/19/2014).   WaVenia Carbon Michiel Cowboy CNM   Utilized Interpreter: [phone] communicate with patient.

## 2014-09-05 NOTE — Progress Notes (Signed)
Used [employer] interpreter [phone] Patient reports intense pounding back pain near her kidneys, denies dysuria  Reviewed tip of week with patient

## 2014-09-19 ENCOUNTER — Other Ambulatory Visit (HOSPITAL_COMMUNITY)
Admission: RE | Admit: 2014-09-19 | Discharge: 2014-09-19 | Disposition: A | Discharge status: Home / Self Care | Payer: Medicaid Other | Source: Ambulatory Visit | Attending: Family | Admitting: Family

## 2014-09-19 ENCOUNTER — Ambulatory Visit (INDEPENDENT_AMBULATORY_CARE_PROVIDER_SITE_OTHER): Payer: Medicaid Other | Admitting: Family

## 2014-09-19 VITALS — BP 90/66 | HR 96 | Temp 98.3°F | Wt 291.1 lb

## 2014-09-19 DIAGNOSIS — Z113 Encounter for screening for infections with a predominantly sexual mode of transmission: Secondary | ICD-10-CM | POA: Diagnosis not present

## 2014-09-19 DIAGNOSIS — Z3493 Encounter for supervision of normal pregnancy, unspecified, third trimester: Secondary | ICD-10-CM | POA: Diagnosis not present

## 2014-09-19 DIAGNOSIS — Z118 Encounter for screening for other infectious and parasitic diseases: Secondary | ICD-10-CM

## 2014-09-19 LAB — POCT URINALYSIS DIP (DEVICE)
Bilirubin Urine: NEGATIVE
Glucose, UA: NEGATIVE mg/dL
Hgb urine dipstick: NEGATIVE
Ketones, ur: NEGATIVE mg/dL
Nitrite: NEGATIVE
Protein, ur: NEGATIVE mg/dL
Specific Gravity, Urine: 1.02 (ref 1.005–1.030)
Urobilinogen, UA: 1 mg/dL (ref 0.0–1.0)
pH: 6 (ref 5.0–8.0)

## 2014-09-19 LAB — OB RESULTS CONSOLE GC/CHLAMYDIA: Gonorrhea: NEGATIVE

## 2014-09-19 LAB — OB RESULTS CONSOLE GBS: STREP GROUP B AG: POSITIVE

## 2014-09-19 NOTE — Progress Notes (Signed)
[employer] Interpreter # [id number] Pain- lower back

## 2014-09-19 NOTE — Progress Notes (Signed)
Subjective:  Coby Antrobus Rage is a 29 y.o. Z6X0960 at [redacted]w[redacted]d being seen today for ongoing prenatal care.  Patient reports continues to have lower back pain.  Contractions: Not present.  Vag. Bleeding: None. Movement: Present. Denies leaking of fluid.   The following portions of the patient's history were reviewed and updated as appropriate: allergies, current medications, past family history, past medical history, past social history, past surgical history and problem list.   Objective:   Filed Vitals:   09/19/14 1051  BP: 90/66  Pulse: 96  Temp: 98.3 F (36.8 C)  Weight: 291 lb 1.6 oz (132.042 kg)    Fetal Status: Fetal Heart Rate (bpm): 147   Movement: Present     General:  Alert, oriented and cooperative. Patient is in no acute distress.  Skin: Skin is warm and dry. No rash noted.   Cardiovascular: Normal heart rate noted  Respiratory: Normal respiratory effort, no problems with respiration noted  Abdomen: Soft, gravid, appropriate for gestational age. Pain/Pressure: Present     Pelvic: Vag. Bleeding: None Vag D/C Character: White   Cervical exam performed      closed/thick  Extremities: Normal range of motion.  Edema: None  Mental Status: Normal mood and affect. Normal behavior. Normal judgment and thought content.   Urinalysis: Urine Protein: Negative Urine Glucose: Negative  Assessment and Plan:  Pregnancy: G4P1021 at [redacted]w[redacted]d  1. Supervision of normal pregnancy, third trimester - Culture, beta strep (group b only) - GC/Chlamydia probe amp ([place])not at [facility]  1.Back Pain in Pregnancy - Explained normalcy  Preterm labor symptoms and general obstetric precautions including but not limited to vaginal bleeding, contractions, leaking of fluid and fetal movement were reviewed in detail with the patient. Please refer to After Visit Summary for other counseling recommendations.  Return in about 1 week (around 09/26/2014).   Eino Farber Kennith Gain, CNM

## 2014-09-20 LAB — GC/CHLAMYDIA PROBE AMP (~~LOC~~) NOT AT ARMC
CHLAMYDIA, DNA PROBE: NEGATIVE
Neisseria Gonorrhea: NEGATIVE

## 2014-09-21 LAB — CULTURE, BETA STREP (GROUP B ONLY)

## 2014-09-30 ENCOUNTER — Ambulatory Visit (INDEPENDENT_AMBULATORY_CARE_PROVIDER_SITE_OTHER): Payer: Medicaid Other | Admitting: Obstetrics & Gynecology

## 2014-09-30 VITALS — BP 138/88 | HR 108 | Temp 98.3°F | Wt 297.6 lb

## 2014-09-30 DIAGNOSIS — O99820 Streptococcus B carrier state complicating pregnancy: Secondary | ICD-10-CM

## 2014-09-30 DIAGNOSIS — Z758 Other problems related to medical facilities and other health care: Secondary | ICD-10-CM

## 2014-09-30 DIAGNOSIS — Z3493 Encounter for supervision of normal pregnancy, unspecified, third trimester: Secondary | ICD-10-CM

## 2014-09-30 DIAGNOSIS — Z789 Other specified health status: Secondary | ICD-10-CM

## 2014-09-30 DIAGNOSIS — Z22330 Carrier of Group B streptococcus: Secondary | ICD-10-CM

## 2014-09-30 LAB — POCT URINALYSIS DIP (DEVICE)
Bilirubin Urine: NEGATIVE
GLUCOSE, UA: NEGATIVE mg/dL
Nitrite: NEGATIVE
PROTEIN: 30 mg/dL — AB
UROBILINOGEN UA: 0.2 mg/dL (ref 0.0–1.0)
pH: 5.5 (ref 5.0–8.0)

## 2014-09-30 NOTE — Progress Notes (Signed)
Subjective:  Kelli Russell is a 29 y.o. Z6X0960 at [redacted]w[redacted]d being seen today for ongoing prenatal care.  [place]i phone interpreter used. Patient reports backache and occasional contractions.  Contractions: Not present.  Vag. Bleeding: None. Movement: Present. Denies leaking of fluid.   The following portions of the patient's history were reviewed and updated as appropriate: allergies, current medications, past family history, past medical history, past social history, past surgical history and problem list.   Objective:   Filed Vitals:   09/30/14 0952  BP: 138/88  Pulse: 108  Temp: 98.3 F (36.8 C)  Weight: 297 lb 9.6 oz (134.99 kg)    Fetal Status: Fetal Heart Rate (bpm): 150 Fundal Height: 39 cm Movement: Present     General:  Alert, oriented and cooperative. Patient is in no acute distress.  Skin: Skin is warm and dry. No rash noted.   Cardiovascular: Normal heart rate noted  Respiratory: Normal respiratory effort, no problems with respiration noted  Abdomen: Soft, gravid, appropriate for gestational age. Pain/Pressure: Present     Pelvic: Vag. Bleeding: None     Cervical exam deferred        Extremities: Normal range of motion.  Edema: Trace  Mental Status: Normal mood and affect. Normal behavior. Normal judgment and thought content.   Urinalysis: Urine Protein: 1+ Urine Glucose: Negative  Assessment and Plan:  Pregnancy: G4P1021 at [redacted]w[redacted]d  1. Group B Streptococcus carrier, +RV culture, currently pregnant Treat in labor with PCN  2. Language barrier Somali interpreter  3. Supervision of normal pregnancy, third trimester Term labor symptoms and general obstetric precautions including but not limited to vaginal bleeding, contractions, leaking of fluid and fetal movement were reviewed in detail with the patient. Please refer to After Visit Summary for other counseling recommendations.  Return in about 1 week (around 10/07/2014) for OB Visit.   Tereso Newcomer, MD

## 2014-09-30 NOTE — Patient Instructions (Addendum)
Return to clinic for any obstetric concerns or go to [facility]U for evaluation Group B Streptococcus During Pregnancy Group B streptococcus (GBS) is a type of bacteria often found in healthy women. GBS is not the same as the bacteria that causes strep throat. You may have GBS in your vagina, rectum, or bladder. GBS does not spread through sexual contact, but it can be passed to a baby during childbirth. This can be dangerous for your baby. It is not dangerous to you and usually does not cause any symptoms. Your health care provider may test you for GBS when your pregnancy is between 35 and 37 weeks. GBS is dangerous only during birth, so there is no need to test for it earlier. It is possible to have GBS during pregnancy and never pass it to your baby. If your test results are positive for GBS, your health care provider may recommend giving you antibiotic medicine during delivery to make sure your baby stays healthy. RISK FACTORS You are more likely to pass GBS to your baby if:   Your water breaks (ruptured membrane) or you go into labor before 37 weeks.  Your water breaks 18 hours before you deliver.  You passed GBS during a previous pregnancy.  You have a urinary tract infection caused by GBS any time during pregnancy.  You have a fever during labor. SYMPTOMS Most women who have GBS do not have any symptoms. If you have a urinary tract infection caused by GBS, you might have frequent or painful urination and fever. Babies who get GBS usually show symptoms within 7 days of birth. Symptoms may include:   Breathing problems.  Heart and blood pressure problems.  Digestive and kidney problems. DIAGNOSIS Routine screening for GBS is recommended for all pregnant women. A health care provider takes a sample of the fluid in your vagina and rectum with a swab. It is then sent to a lab to be checked for GBS. A sample of your urine may also be checked for the bacteria.  TREATMENT If you test positive for  GBS, you may need treatment with an antibiotic medicine during labor. As soon as you go into labor, or as soon as your membranes rupture, you will get the antibiotic medicine through an IV access. You will continue to get the medicine until after you give birth. You do not need antibiotic medicine if you are having a cesarean delivery.If your baby shows signs or symptoms of GBS after birth, your baby can also be treated with an antibiotic medicine. HOME CARE INSTRUCTIONS   Take all antibiotic medicine as prescribed by your health care provider. Only take medicine as directed.   Continue with prenatal visits and care.   Keep all follow-up appointments.  SEEK MEDICAL CARE IF:   You have pain when you urinate.   You have to urinate frequently.   You have a fever.  SEEK IMMEDIATE MEDICAL CARE IF:   Your membranes rupture.  You go into labor. Document Released: 04/06/2007 Document Revised: 01/02/2013 Document Reviewed: 10/20/2012 [facility] Patient Information 2015 [place], [place]. This information is not intended to replace advice given to you by your health care provider. Make sure you discuss any questions you have with your health care provider.

## 2014-09-30 NOTE — Progress Notes (Signed)
[employer] interpreter [phone] Urine trace hgb, ketones, leukocytes Breastfeeding tip of the week reviewed

## 2014-10-07 ENCOUNTER — Telehealth: Payer: Self-pay | Admitting: *Deleted

## 2014-10-07 ENCOUNTER — Ambulatory Visit (INDEPENDENT_AMBULATORY_CARE_PROVIDER_SITE_OTHER): Payer: Medicaid Other | Admitting: Obstetrics & Gynecology

## 2014-10-07 VITALS — BP 137/84 | HR 113 | Temp 98.0°F | Wt 297.9 lb

## 2014-10-07 DIAGNOSIS — Z3483 Encounter for supervision of other normal pregnancy, third trimester: Secondary | ICD-10-CM

## 2014-10-07 DIAGNOSIS — R030 Elevated blood-pressure reading, without diagnosis of hypertension: Secondary | ICD-10-CM

## 2014-10-07 DIAGNOSIS — IMO0001 Reserved for inherently not codable concepts without codable children: Secondary | ICD-10-CM

## 2014-10-07 LAB — POCT URINALYSIS DIP (DEVICE)
Bilirubin Urine: NEGATIVE
Glucose, UA: NEGATIVE mg/dL
Hgb urine dipstick: NEGATIVE
Ketones, ur: NEGATIVE mg/dL
NITRITE: NEGATIVE
PH: 5.5 (ref 5.0–8.0)
PROTEIN: 30 mg/dL — AB
Specific Gravity, Urine: >=1.03 (ref 1.005–1.030)
UROBILINOGEN UA: 0.2 mg/dL (ref 0.0–1.0)

## 2014-10-07 LAB — COMPREHENSIVE METABOLIC PANEL
ALBUMIN: 3.2 g/dL — AB (ref 3.6–5.1)
ALK PHOS: 130 U/L — AB (ref 33–115)
ALT: 5 U/L — AB (ref 6–29)
AST: 8 U/L — ABNORMAL LOW (ref 10–30)
BUN: 7 mg/dL (ref 7–25)
CHLORIDE: 104 mmol/L (ref 98–110)
CO2: 22 mmol/L (ref 20–31)
CREATININE: 0.49 mg/dL — AB (ref 0.50–1.10)
Calcium: 8.8 mg/dL (ref 8.6–10.2)
Glucose, Bld: 111 mg/dL — ABNORMAL HIGH (ref 65–99)
POTASSIUM: 4.5 mmol/L (ref 3.5–5.3)
Sodium: 135 mmol/L (ref 135–146)
TOTAL PROTEIN: 6.2 g/dL (ref 6.1–8.1)
Total Bilirubin: 0.3 mg/dL (ref 0.2–1.2)

## 2014-10-07 LAB — CBC
HCT: 38.9 % (ref 36.0–46.0)
HEMOGLOBIN: 13.4 g/dL (ref 12.0–15.0)
MCH: 29.4 pg (ref 26.0–34.0)
MCHC: 34.4 g/dL (ref 30.0–36.0)
MCV: 85.3 fL (ref 78.0–100.0)
MPV: 11.5 fL (ref 8.6–12.4)
Platelets: 392 10*3/uL (ref 150–400)
RBC: 4.56 MIL/uL (ref 3.87–5.11)
RDW: 14 % (ref 11.5–15.5)
WBC: 9.6 10*3/uL (ref 4.0–10.5)

## 2014-10-07 LAB — PROTEIN / CREATININE RATIO, URINE
Creatinine, Urine: 178.3 mg/dL
Protein Creatinine Ratio: 0.33 — ABNORMAL HIGH (ref <0.15)
Total Protein, Urine: 59 mg/dL — ABNORMAL HIGH (ref 5–24)

## 2014-10-07 NOTE — Progress Notes (Signed)
3rd BP 128/87 Subjective:  Kelli Russell is a 29 y.o. [id number] at 46w5dbeing seen today for ongoing prenatal care.  Patient reports not sleeping well yesterday,.  Contractions: Not present.  Vag. Bleeding: None. Movement: Present. Denies leaking of fluid.   The following portions of the patient's history were reviewed and updated as appropriate: allergies, current medications, past family history, past medical history, past social history, past surgical history and problem list.   Objective:   Filed Vitals:   10/07/14 0942 10/07/14 0947  BP: 138/97 137/84  Pulse: 113   Temp: 98 F (36.7 C)   Weight: 297 lb 14.4 oz (135.127 kg)     Fetal Status: Fetal Heart Rate (bpm): 146 Fundal Height: 42 cm Movement: Present     General:  Alert, oriented and cooperative. Patient is in no acute distress.  Skin: Skin is warm and dry. No rash noted.   Cardiovascular: Normal heart rate noted  Respiratory: Normal respiratory effort, no problems with respiration noted  Abdomen: Soft, gravid, appropriate for gestational age. Pain/Pressure: Present     Pelvic: Vag. Bleeding: None     Cervical exam performed        1.5/50/-3  Extremities: Normal range of motion.  Edema: Trace  Mental Status: Normal mood and affect. Normal behavior. Normal judgment and thought content.   Urinalysis: Urine Protein: 1+ Urine Glucose: Negative  Assessment and Plan:  Pregnancy: G4P1021 at 332w5d1. Elevated BP x1 (2 others were nml) Labs - Protein / creatinine ratio, urine - CBC - Comp Met (CMET) - [place] Follow Up; Future (Size > dates) - BP check  Term labor symptoms and general obstetric precautions including but not limited to vaginal bleeding, contractions, leaking of fluid and fetal movement were reviewed in detail with the patient. Please refer to After Visit Summary for other counseling recommendations.  Return in about 2 days (around 10/09/2014).   KeGuss BundeMD

## 2014-10-07 NOTE — Progress Notes (Signed)
Breastfeeding tip of the week reviewed Leukocytes: Nature conservation officer ID# [id number]

## 2014-10-07 NOTE — Telephone Encounter (Addendum)
Notified by Dr. Adrian Blackwater patients preeclampsia workup came back with elevated labs, patient needs to come to [facility]U for evaluation.  Called patient home number with [employer] Interpreter#[phone] and left message we are calling to let you know we need you to come to MAU for evaluation as soon as possible. Called mobile number and unable to leave message- no one answered and no voicemail answered. Notified Dr. Adrian Blackwater I did not reach patient but left message.   9/28  0920  Pt arrived to clinic. Per consult with Dr. Penne Lash - plan of care is for pt to have evaluation in MAU for pre-eclampsia and [place] to check growth.  Pt will likely be admitted for IOL.  Diane Day RNC

## 2014-10-09 ENCOUNTER — Inpatient Hospital Stay (HOSPITAL_COMMUNITY): Payer: Medicaid Other

## 2014-10-09 ENCOUNTER — Inpatient Hospital Stay (HOSPITAL_COMMUNITY)
Admission: AD | Admit: 2014-10-09 | Discharge: 2014-10-13 | DRG: 775 | Disposition: A | Discharge status: Home / Self Care | Payer: Medicaid Other | Source: Ambulatory Visit | Attending: Family Medicine | Admitting: Family Medicine

## 2014-10-09 ENCOUNTER — Encounter (HOSPITAL_COMMUNITY): Payer: Self-pay | Admitting: *Deleted

## 2014-10-09 ENCOUNTER — Ambulatory Visit: Payer: Medicaid Other | Admitting: *Deleted

## 2014-10-09 DIAGNOSIS — O321XX0 Maternal care for breech presentation, not applicable or unspecified: Secondary | ICD-10-CM | POA: Diagnosis not present

## 2014-10-09 DIAGNOSIS — O99210 Obesity complicating pregnancy, unspecified trimester: Secondary | ICD-10-CM | POA: Diagnosis present

## 2014-10-09 DIAGNOSIS — Z789 Other specified health status: Secondary | ICD-10-CM | POA: Diagnosis present

## 2014-10-09 DIAGNOSIS — O1403 Mild to moderate pre-eclampsia, third trimester: Secondary | ICD-10-CM | POA: Diagnosis not present

## 2014-10-09 DIAGNOSIS — O26843 Uterine size-date discrepancy, third trimester: Secondary | ICD-10-CM

## 2014-10-09 DIAGNOSIS — O133 Gestational [pregnancy-induced] hypertension without significant proteinuria, third trimester: Secondary | ICD-10-CM

## 2014-10-09 DIAGNOSIS — O99820 Streptococcus B carrier state complicating pregnancy: Secondary | ICD-10-CM

## 2014-10-09 DIAGNOSIS — O3483 Maternal care for other abnormalities of pelvic organs, third trimester: Secondary | ICD-10-CM | POA: Diagnosis present

## 2014-10-09 DIAGNOSIS — O1400 Mild to moderate pre-eclampsia, unspecified trimester: Secondary | ICD-10-CM | POA: Diagnosis present

## 2014-10-09 DIAGNOSIS — O9962 Diseases of the digestive system complicating childbirth: Secondary | ICD-10-CM | POA: Diagnosis present

## 2014-10-09 DIAGNOSIS — Z3A39 39 weeks gestation of pregnancy: Secondary | ICD-10-CM

## 2014-10-09 DIAGNOSIS — O99214 Obesity complicating childbirth: Secondary | ICD-10-CM | POA: Diagnosis not present

## 2014-10-09 DIAGNOSIS — IMO0001 Reserved for inherently not codable concepts without codable children: Secondary | ICD-10-CM

## 2014-10-09 DIAGNOSIS — K219 Gastro-esophageal reflux disease without esophagitis: Secondary | ICD-10-CM | POA: Diagnosis present

## 2014-10-09 DIAGNOSIS — Z6841 Body Mass Index (BMI) 40.0 and over, adult: Secondary | ICD-10-CM

## 2014-10-09 DIAGNOSIS — O99824 Streptococcus B carrier state complicating childbirth: Secondary | ICD-10-CM | POA: Diagnosis present

## 2014-10-09 DIAGNOSIS — N90810 Female genital mutilation status, unspecified: Secondary | ICD-10-CM | POA: Diagnosis present

## 2014-10-09 DIAGNOSIS — O329XX1 Maternal care for malpresentation of fetus, unspecified, fetus 1: Secondary | ICD-10-CM

## 2014-10-09 DIAGNOSIS — O1404 Mild to moderate pre-eclampsia, complicating childbirth: Secondary | ICD-10-CM | POA: Diagnosis present

## 2014-10-09 DIAGNOSIS — O99213 Obesity complicating pregnancy, third trimester: Secondary | ICD-10-CM

## 2014-10-09 LAB — CBC
HEMATOCRIT: 38.9 % (ref 36.0–46.0)
HEMOGLOBIN: 13.5 g/dL (ref 12.0–15.0)
MCH: 30 pg (ref 26.0–34.0)
MCHC: 34.7 g/dL (ref 30.0–36.0)
MCV: 86.4 fL (ref 78.0–100.0)
Platelets: 361 10*3/uL (ref 150–400)
RBC: 4.5 MIL/uL (ref 3.87–5.11)
RDW: 14.1 % (ref 11.5–15.5)
WBC: 11.3 10*3/uL — ABNORMAL HIGH (ref 4.0–10.5)

## 2014-10-09 LAB — BASIC METABOLIC PANEL
ANION GAP: 7 (ref 5–15)
BUN: 6 mg/dL (ref 6–20)
CALCIUM: 9.2 mg/dL (ref 8.9–10.3)
CHLORIDE: 105 mmol/L (ref 101–111)
CO2: 23 mmol/L (ref 22–32)
Creatinine, Ser: 0.52 mg/dL (ref 0.44–1.00)
GFR calc non Af Amer: >60 mL/min (ref >60)
Glucose, Bld: 92 mg/dL (ref 65–99)
Potassium: 4.4 mmol/L (ref 3.5–5.1)
Sodium: 135 mmol/L (ref 135–145)

## 2014-10-09 LAB — URINE MICROSCOPIC-ADD ON

## 2014-10-09 LAB — URINALYSIS, ROUTINE W REFLEX MICROSCOPIC
BILIRUBIN URINE: NEGATIVE
Glucose, UA: NEGATIVE mg/dL
KETONES UR: NEGATIVE mg/dL
Leukocytes, UA: NEGATIVE
NITRITE: NEGATIVE
Protein, ur: NEGATIVE mg/dL
UROBILINOGEN UA: 0.2 mg/dL (ref 0.0–1.0)
pH: 6 (ref 5.0–8.0)

## 2014-10-09 LAB — TYPE AND SCREEN
ABO/RH(D): O POS
Antibody Screen: NEGATIVE

## 2014-10-09 LAB — PROTEIN / CREATININE RATIO, URINE
CREATININE, URINE: 154 mg/dL
Protein Creatinine Ratio: 0.18 mg/mg{Cre} — ABNORMAL HIGH (ref 0.00–0.15)
TOTAL PROTEIN, URINE: 27 mg/dL

## 2014-10-09 LAB — ABO/RH: ABO/RH(D): O POS

## 2014-10-09 MED ORDER — TERBUTALINE SULFATE 1 MG/ML IJ SOLN
0.2500 mg | Freq: Once | INTRAMUSCULAR | Status: AC
  Administered 2014-10-09: 0.25 mg via SUBCUTANEOUS

## 2014-10-09 MED ORDER — OXYTOCIN BOLUS FROM INFUSION
500.0000 mL | INTRAVENOUS | Status: DC
  Administered 2014-10-11: 500 mL via INTRAVENOUS

## 2014-10-09 MED ORDER — TERBUTALINE SULFATE 1 MG/ML IJ SOLN: 0.2500 mg | Freq: Once | INTRAMUSCULAR | Status: DC | PRN

## 2014-10-09 MED ORDER — PENICILLIN G POTASSIUM 5000000 UNITS IJ SOLR
5.0000 10*6.[IU] | Freq: Once | INTRAMUSCULAR | Status: AC
  Administered 2014-10-10: 5 10*6.[IU] via INTRAVENOUS
  Filled 2014-10-09: qty 5

## 2014-10-09 MED ORDER — LACTATED RINGERS IV SOLN
INTRAVENOUS | Status: DC
  Administered 2014-10-09 – 2014-10-10 (×3): via INTRAVENOUS
  Administered 2014-10-10: 1000 mL via INTRAVENOUS
  Administered 2014-10-11: 125 mL/h via INTRAVENOUS
  Administered 2014-10-11: 09:00:00 via INTRAVENOUS

## 2014-10-09 MED ORDER — ACETAMINOPHEN 325 MG PO TABS: 650.0000 mg | ORAL_TABLET | ORAL | Status: DC | PRN

## 2014-10-09 MED ORDER — TERBUTALINE SULFATE 1 MG/ML IJ SOLN
INTRAMUSCULAR | Status: AC
  Filled 2014-10-09: qty 1

## 2014-10-09 MED ORDER — CITRIC ACID-SODIUM CITRATE 334-500 MG/5ML PO SOLN: 30.0000 mL | ORAL | Status: DC | PRN

## 2014-10-09 MED ORDER — OXYCODONE-ACETAMINOPHEN 5-325 MG PO TABS: 1.0000 | ORAL_TABLET | ORAL | Status: DC | PRN

## 2014-10-09 MED ORDER — ONDANSETRON HCL 4 MG/2ML IJ SOLN: 4.0000 mg | Freq: Four times a day (QID) | INTRAMUSCULAR | Status: DC | PRN

## 2014-10-09 MED ORDER — PENICILLIN G POTASSIUM 5000000 UNITS IJ SOLR
2.5000 10*6.[IU] | INTRAVENOUS | Status: DC
  Administered 2014-10-10 – 2014-10-11 (×7): 2.5 10*6.[IU] via INTRAVENOUS
  Filled 2014-10-09 (×11): qty 2.5

## 2014-10-09 MED ORDER — LACTATED RINGERS IV SOLN: 500.0000 mL | INTRAVENOUS | Status: DC | PRN

## 2014-10-09 MED ORDER — MISOPROSTOL 25 MCG QUARTER TABLET
25.0000 ug | ORAL_TABLET | ORAL | Status: DC | PRN
  Administered 2014-10-10 (×2): 25 ug via VAGINAL
  Filled 2014-10-09 (×2): qty 0.25

## 2014-10-09 MED ORDER — OXYCODONE-ACETAMINOPHEN 5-325 MG PO TABS: 2.0000 | ORAL_TABLET | ORAL | Status: DC | PRN

## 2014-10-09 MED ORDER — TERBUTALINE SULFATE 1 MG/ML IJ SOLN
INTRAMUSCULAR | Status: AC
  Administered 2014-10-09: 0.25 mg via SUBCUTANEOUS
  Filled 2014-10-09: qty 1

## 2014-10-09 MED ORDER — OXYTOCIN 40 UNITS IN LACTATED RINGERS INFUSION - SIMPLE MED
62.5000 mL/h | INTRAVENOUS | Status: DC
  Filled 2014-10-09: qty 1000

## 2014-10-09 MED ORDER — LIDOCAINE HCL (PF) 1 % IJ SOLN
30.0000 mL | INTRAMUSCULAR | Status: AC | PRN
  Administered 2014-10-11: 30 mL via SUBCUTANEOUS
  Filled 2014-10-09: qty 30

## 2014-10-09 NOTE — Progress Notes (Signed)
[employer] Interpreter used for interpretation of sterile vaginal exam by CNM and plan of care.  Patients questions answered.

## 2014-10-09 NOTE — Progress Notes (Signed)
Dr. Redmond Baseman discussed admission.

## 2014-10-09 NOTE — MAU Note (Signed)
Sent from clinic for further evaluation of elevated BP 

## 2014-10-09 NOTE — Progress Notes (Signed)
Kelli Russell is a 29 y.o. [id number] at [redacted]w[redacted]d by ultrasound admitted for induction of labor due to Pre-eclamptic toxemia of pregnancy..  Subjective: Mostly comfortable; recent ECV successful; used [place] interpreter for conversation  Objective: BP 143/86 mmHg  Pulse 102  Temp(Src) 98.1 F (36.7 C) (Oral)  Resp 18  Ht  (1.702 m)  Wt 134.718 kg (297 lb)  BMI 46.51 kg/m2  LMP 12/26/2013 (Exact Date)      FHT:  FHR: 145-150 bpm, variability: moderate,  accelerations:  Present,  decelerations:  Absent UC:   Irreg, mild SVE:   Dilation: Fingertip Effacement (%): 50 Exam by:: Pincus Badder CNM  Labs: Lab Results  Component Value Date   WBC 11.3* 10/09/2014   HGB 13.5 10/09/2014   HCT 38.9 10/09/2014   MCV 86.4 10/09/2014   PLT 361 10/09/2014    Assessment / Plan: IOL process Mild pre-E  Will use cytotec for cx ripening BPs stable for now without meds All questions answered by pt and family  Cam Hai CNM 10/09/2014, 9:44 PM

## 2014-10-09 NOTE — Progress Notes (Signed)
Dr. Redmond Baseman in to assess patient and discuss plan of care. [employer] Interpretor used for translation.

## 2014-10-09 NOTE — H&P (Signed)
OBSTETRIC ADMISSION HISTORY AND PHYSICAL  Kelli Russell is a 29 y.o. female [phone] with IUP at [redacted]w[redacted]d by LMP presenting for elevated BP and proteinuria (UPC 0.33) meeting criteria for mild preeclampsia without severe features and found to be breech on [place].  She reports +FMs, No LOF, no VB, no blurry vision, headaches or peripheral edema, and RUQ pain.  She plans on breast feeding. She request unsure for birth control.  Dating: By LMP --->  Estimated Date of Delivery: 10/16/14  Sono:    EFW 3485g,76th%  EFW 2185, 78th%  Anterior/fundal placenta without previa. Interval anatomy wnl. EFW 57th%  follow up anatomy, wnl. Anterior placenta, no previa  Anatomy scan, wnl  Prenatal History/Complications:  Clinic [facility] Prenatal Labs  Dating LMP Blood type: O/POS/-- (03/03 1037)   Genetic Screen  normal quad Antibody:NEG (03/03 1037)  Anatomic [place] Nml Rubella: IMMUNE  GTT Early:    98           Third trimester: 128 RPR: NON REAC (03/03 1037)   Flu vaccine  09/05/14 HBsAg: NEGATIVE (03/03 1037)   TDaP vaccine    07/25/14   HIV:   Negative  GBS    Pos                               GBS:       Pos                               Contraception unsure Pap:  Negative; GC/CT neg x 2  Baby Food Breast   Circumcision  Female   Pediatrician  [facility]; [facility]   Support Person [place]    Past Medical History: Past Medical History  Diagnosis Date  . Kidney stones   . Female circumcision    Past Surgical History: History reviewed. No pertinent past surgical history.  Obstetrical History: OB History    Gravida Para Term Preterm AB TAB SAB Ectopic Multiple Living   0 2 0 2 0  1      Social History: Social History   Social History  . Marital Status: Married    Spouse Name: N/A  . Number of Children: N/A  . Years of Education: N/A   Social History Main Topics  . Smoking status: Never Smoker   . Smokeless tobacco: None   . Alcohol Use: No  . Drug Use: No  . Sexual Activity: Yes   Other Topics Concern  . None   Social History Narrative   ** Merged History Encounter **       Family History: History reviewed. No pertinent family history.  Allergies: No Known Allergies  Prescriptions prior to admission  Medication Sig Dispense Refill Last Dose  . Prenatal Multivit-Min-Fe-FA (PRENATAL VITAMINS) 0.8 MG tablet Take 1 tablet by mouth daily. 30 tablet 7 10/08/2014 at Unknown time    Review of Systems  All systems reviewed and negative except as stated in HPI  Blood pressure 126/64, pulse 95, temperature 98.2 F (36.8 C), temperature source Oral, resp. rate 18, last menstrual period 12/26/2013. General appearance: alert and cooperative Lungs: clear to auscultation bilaterally Heart: regular rate and rhythm Abdomen: soft, non-tender; bowel sounds normal Pelvic:  Extremities: Homans sign is negative, no sign of DVT  Presentation: breech Fetal monitoringBaseline: 130 bpm, Variability: Good {> 6 bpm), Accelerations:  Reactive and Decelerations: Absent Uterine activityNone    Prenatal labs: ABO, Rh: O/POS/-- (03/03 1037) Antibody: NEG (03/03 1037) Rubella:  IMMUNE RPR: NON REAC (07/14 1530)  HBsAg: NEGATIVE (03/03 1037)  HIV: NONREACTIVE (07/14 1530)  GBS: Positive (09/08 0000)  1 hr Glucola -early 98, Third trimester 128 Genetic screening  Normal quad Anatomy US nml  Prenatal Transfer Tool  Maternal Diabetes: No Genetic Screening: Normal Maternal Ultrasounds/Referrals: Normal Fetal Ultrasounds or other Referrals:  None Maternal Substance Abuse:  No Significant Maternal Medications:  None Significant Maternal Lab Results: Lab values include: Group B Strep negative  Results for orders placed or performed during the hospital encounter of 10/09/14 (from the past 24 hour(s))  Urinalysis, Routine w reflex microscopic (not at [facility]   Collection Time: 10/09/14 10:36 AM  Result Value Ref Range    Color, Urine YELLOW YELLOW   APPearance CLEAR CLEAR   Specific Gravity, Urine >1.030 (H) 1.005 - 1.030   pH 6.0 5.0 - 8.0   Glucose, UA NEGATIVE NEGATIVE mg/dL   Hgb urine dipstick TRACE (A) NEGATIVE   Bilirubin Urine NEGATIVE NEGATIVE   Ketones, ur NEGATIVE NEGATIVE mg/dL   Protein, ur NEGATIVE NEGATIVE mg/dL   Urobilinogen, UA 0.2 0.0 - 1.0 mg/dL   Nitrite NEGATIVE NEGATIVE   Leukocytes, UA NEGATIVE NEGATIVE  Urine microscopic-add on   Collection Time: 10/09/14 10:36 AM  Result Value Ref Range   Squamous Epithelial / LPF FEW (A) RARE   RBC / HPF 0-2 <3 RBC/hpf   Bacteria, UA RARE RARE  Protein / creatinine ratio, urine   Collection Time: 10/09/14 10:37 AM  Result Value Ref Range   Creatinine, Urine 154.00 mg/dL   Total Protein, Urine 27 mg/dL   Protein Creatinine Ratio 0.18 (H) 0.00 - 0.15 mg/mg[Cre]    Patient Active Problem List   Diagnosis Date Noted  . Group B Streptococcus carrier, +RV culture, currently pregnant 09/30/2014  . Maternal PPD positive 06/27/2014  . Language barrier 05/30/2014  . Sciatic nerve pain 05/09/2014  . Maternal morbid obesity, antepartum   . Supervision of normal pregnancy 03/14/2014    Assessment: Kelli Russell is a 29 y.o. G4P1021 at [redacted]w[redacted]d here for IOL vs CS for mild preeclampsia  #Labor: Infant is currently breech, will plan to ECV and then IOL vs CS if fails version.  #Pain: Prn IV meds and epidural if desired and > 5 cm #FWB:  Cat I #ID:  GBS pos, PCN with ROM or > 5cm #MOF: Breast #MOC:undecided #Circ:  Na, female fetus  Federico Flake 10/09/2014, 1:52 PM

## 2014-10-09 NOTE — Progress Notes (Signed)
MD Alvester Morin at bedside discussing version. [employer] interpreter line used.

## 2014-10-09 NOTE — MAU Provider Note (Signed)
  History     CSN: [id number]  Arrival date and time: 10/09/14 1023   None     Chief Complaint  Patient presents with  . Hypertension   HPI Patient is a [id number] at 39+0 here for fetal ultrasound given patient's fetus is measuring large S>D. Patient speaks somali and interview was conducted via phone interpreter. Patient also with recently elevated BPs. Patient has been having increased blurry vision since 8 months pregnant. No recent increase in blurriness. No headaches, no RUQ pain. Has had recent increase in swelling in LE. Patient describes black mucous like discharge since last night. Has some lower back pain. Denies contractions, LOF, VB. Endorses good FM.   OB History    Gravida Para Term Preterm AB TAB SAB Ectopic Multiple Living   0 2 0 2 0  1      Past Medical History  Diagnosis Date  . Kidney stones   . Female circumcision     History reviewed. No pertinent past surgical history.  History reviewed. No pertinent family history.  Social History  Substance Use Topics  . Smoking status: Never Smoker   . Smokeless tobacco: None  . Alcohol Use: No    Allergies: No Known Allergies  Prescriptions prior to admission  Medication Sig Dispense Refill Last Dose  . Prenatal Multivit-Min-Fe-FA (PRENATAL VITAMINS) 0.8 MG tablet Take 1 tablet by mouth daily. 30 tablet 7 Past Month at Unknown time    ROSNegative except as mentioned in HPI Physical Exam   Blood pressure 127/76, pulse 101, resp. rate 20, last menstrual period 12/26/2013.  Physical Exam General: WDWN, NAD CV: RRR Resp: CTAB DTRs: 2/4 b/l patellar tendons MAU Course  Procedures  MDM EFM: FHR: 150, moderate variability, +accels, no Decels, no contractions Category 1 tracing BPs were mildly elevated, recent urine protein/creatinine ratio 0.33, 0.18 today Fetal ultrasound WNL EFW  and 76%ile, breech presentation  Assessment and Plan  G4P1021 at 39+0 here for fetal ultrasound given  patient's fetus is measuring large S>D. -Fetus found to be breech, was vertex 2 days ago -Admit for version and IOL given breech presentation and mild preeclampsia  Durenda Hurt 10/09/2014, 11:18 AM   OB fellow attestation: I have seen and examined this patient; I agree with above documentation in the resident's note. See H&P from same date for more details  Federico Flake, MD 9:49 PM

## 2014-10-09 NOTE — Progress Notes (Signed)
[employer] interpreter [phone] used for encounter.  Pt advised that she needs evaluation of her elevated BP today @ MAU including ultrasound to check the size of the baby. She may need to be admitted for delivery of the baby today.  Pt voiced understanding of all information given and agrees to plan of care.

## 2014-10-09 NOTE — Progress Notes (Signed)
MD Emelda Fear and MD Alvester Morin at bedside for version attempt. Procedure started at 2048. Baby vertex at 2056. Binder applied. FHR 140's immediately following procedure.

## 2014-10-10 ENCOUNTER — Encounter: Payer: Self-pay | Admitting: Family Medicine

## 2014-10-10 LAB — RPR: RPR: NONREACTIVE

## 2014-10-10 MED ORDER — OXYTOCIN 40 UNITS IN LACTATED RINGERS INFUSION - SIMPLE MED: 1.0000 m[IU]/min | INTRAVENOUS | Status: DC

## 2014-10-10 MED ORDER — FENTANYL CITRATE (PF) 100 MCG/2ML IJ SOLN
100.0000 ug | INTRAMUSCULAR | Status: DC | PRN
  Administered 2014-10-10 – 2014-10-11 (×6): 100 ug via INTRAVENOUS
  Filled 2014-10-10 (×6): qty 2

## 2014-10-10 MED ORDER — ZOLPIDEM TARTRATE 5 MG PO TABS
5.0000 mg | ORAL_TABLET | Freq: Every evening | ORAL | Status: DC | PRN
  Administered 2014-10-10: 5 mg via ORAL
  Filled 2014-10-10: qty 1

## 2014-10-10 MED ORDER — OXYTOCIN 40 UNITS IN LACTATED RINGERS INFUSION - SIMPLE MED
1.0000 m[IU]/min | INTRAVENOUS | Status: DC
  Administered 2014-10-11: 2 m[IU]/min via INTRAVENOUS
  Filled 2014-10-10: qty 1000

## 2014-10-10 MED ORDER — MISOPROSTOL 25 MCG QUARTER TABLET
25.0000 ug | ORAL_TABLET | ORAL | Status: AC
  Administered 2014-10-10 – 2014-10-11 (×2): 25 ug via VAGINAL
  Filled 2014-10-10 (×2): qty 0.25

## 2014-10-10 MED ORDER — TERBUTALINE SULFATE 1 MG/ML IJ SOLN: 0.2500 mg | Freq: Once | INTRAMUSCULAR | Status: DC | PRN

## 2014-10-10 MED ORDER — OXYCODONE-ACETAMINOPHEN 5-325 MG PO TABS: 2.0000 | ORAL_TABLET | Freq: Once | ORAL | Status: DC | PRN

## 2014-10-10 MED ORDER — MISOPROSTOL 50MCG HALF TABLET: 50.0000 ug | ORAL_TABLET | ORAL | Status: DC | PRN

## 2014-10-10 NOTE — Progress Notes (Signed)
Labor Progress Note  S: Feeling contractions, but not very painful.  O:  BP 136/90 mmHg  Pulse 102  Temp(Src) 98.3 F (36.8 C) (Oral)  Resp 18  Ht  (1.702 m)  Wt 297 lb (134.718 kg)  BMI 46.51 kg/m2  LMP 12/26/2013 (Exact Date) EFM: FHR: 140, moderate variability, +accels, no decels; toco: contractions q1-4 minutes [id number]: 2.5 Effacement (%): 70 Cervical Position: Middle Station: -3 Presentation: Vertex Exam by:: Hayden   A&P: 29 y.o. [id number] [redacted]w[redacted]d here for IOL for mild preeclampsia s/p version on 9/28 #Labor: Bishop score: 10, Start pitocin #FWB: Category 1 tracing #GBS: Positive-Start PCN # PreEclampsia: has not required prn labetalol.   Durenda Hurt, DO Resident Physician 11:09 AM

## 2014-10-10 NOTE — Progress Notes (Signed)
   Kelli Russell is a 29 y.o. [id number] at [redacted]w[redacted]d  admitted for induction of labor due to preeclampsia.  Subjective: Contractions are mild  Objective: Filed Vitals:   10/10/14 1735 10/10/14 1817 10/10/14 1855 10/10/14 2011  BP: 125/77 121/72 119/73 124/72  Pulse: 97 97 114 91  Temp:    97.9 F (36.6 C)  TempSrc:    Oral  Resp: Height:      Weight:          FHT:  FHR: 140 bpm, variability: moderate,  accelerations:  Present,  decelerations:  Absent UC:   Irregular and mild, q 2-5 minutes SVE: 3-4/thick/-3 Pitocin @ 24 mu/min  Labs: Lab Results  Component Value Date   WBC 11.3* 10/09/2014   HGB 13.5 10/09/2014   HCT 38.9 10/09/2014   MCV 86.4 10/09/2014   PLT 361 10/09/2014    Assessment / Plan: ripening phase labor; preeclampsia--stable Cx still unripe, contractions are not strong at all. Discussed POC with Dr. Macon Large.  Will stop pit, ripen cx more with cytotec, restart pitocin. Will allow pt to shower/eat. Pt agreeable Labor: ripening phase Fetal Wellbeing:  Category I Pain Control:  Fentanyl X1 Anticipated MOD:  NSVD  CRESENZO-DISHMAN,Kisean Rollo 10/10/2014, 9:28 PM

## 2014-10-10 NOTE — Progress Notes (Signed)
Labor Progress Note  S: Patient states contractions are painful and would like IV medication to help with pain.  O:  BP 131/73 mmHg  Pulse 97  Temp(Src) 97.8 F (36.6 C) (Oral)  Resp 18  Ht  (1.702 m)  Wt 297 lb (134.718 kg)  BMI 46.51 kg/m2  LMP 12/26/2013 (Exact Date) EFM: FHR: 130, min to moderate variability, +accels, no decels; Toco-contractions q1-38min [id number]: 3 Effacement (%): 80 Cervical Position: Middle Station: -3, Ballotable Presentation: Vertex Exam by:: Dr Redmond Baseman and Herma Carson, rn   A&P: 29 y.o. [phone] [redacted]w[redacted]d IOL for preeclampsia, s/p version on 9/28 #Labor: Continue to increase pitocin. Head not well applied for AROM. #FWB: Category 1 tracing #GBS: Positive-PCN # PreEclampsia: has not required any PRN labetalol  Durenda Hurt, DO Resident Physician 4:07 PM

## 2014-10-10 NOTE — Progress Notes (Signed)
[employer] used via phone, [phone]

## 2014-10-11 ENCOUNTER — Inpatient Hospital Stay (HOSPITAL_COMMUNITY): Payer: Medicaid Other | Admitting: Anesthesiology

## 2014-10-11 ENCOUNTER — Encounter: Payer: Self-pay | Admitting: Family Medicine

## 2014-10-11 ENCOUNTER — Encounter (HOSPITAL_COMMUNITY): Payer: Self-pay | Admitting: Anesthesiology

## 2014-10-11 DIAGNOSIS — O99214 Obesity complicating childbirth: Secondary | ICD-10-CM

## 2014-10-11 DIAGNOSIS — O1403 Mild to moderate pre-eclampsia, third trimester: Secondary | ICD-10-CM

## 2014-10-11 DIAGNOSIS — O99824 Streptococcus B carrier state complicating childbirth: Secondary | ICD-10-CM

## 2014-10-11 DIAGNOSIS — Z3A39 39 weeks gestation of pregnancy: Secondary | ICD-10-CM

## 2014-10-11 LAB — CBC
HEMATOCRIT: 35.3 % — AB (ref 36.0–46.0)
Hemoglobin: 12.5 g/dL (ref 12.0–15.0)
MCH: 30.8 pg (ref 26.0–34.0)
MCHC: 35.4 g/dL (ref 30.0–36.0)
MCV: 86.9 fL (ref 78.0–100.0)
Platelets: 342 10*3/uL (ref 150–400)
RBC: 4.06 MIL/uL (ref 3.87–5.11)
RDW: 14.2 % (ref 11.5–15.5)
WBC: 10.7 10*3/uL — AB (ref 4.0–10.5)

## 2014-10-11 MED ORDER — SENNOSIDES-DOCUSATE SODIUM 8.6-50 MG PO TABS
2.0000 | ORAL_TABLET | ORAL | Status: DC
  Administered 2014-10-11 – 2014-10-12 (×2): 2 via ORAL
  Filled 2014-10-11 (×2): qty 2

## 2014-10-11 MED ORDER — DIPHENHYDRAMINE HCL 50 MG/ML IJ SOLN: 12.5000 mg | INTRAMUSCULAR | Status: DC | PRN

## 2014-10-11 MED ORDER — OXYCODONE-ACETAMINOPHEN 5-325 MG PO TABS: 1.0000 | ORAL_TABLET | ORAL | Status: DC | PRN

## 2014-10-11 MED ORDER — LANOLIN HYDROUS EX OINT: TOPICAL_OINTMENT | CUTANEOUS | Status: DC | PRN

## 2014-10-11 MED ORDER — EPHEDRINE 5 MG/ML INJ: 10.0000 mg | INTRAVENOUS | Status: DC | PRN

## 2014-10-11 MED ORDER — DIBUCAINE 1 % RE OINT: 1.0000 "application " | TOPICAL_OINTMENT | RECTAL | Status: DC | PRN

## 2014-10-11 MED ORDER — IBUPROFEN 600 MG PO TABS
600.0000 mg | ORAL_TABLET | Freq: Four times a day (QID) | ORAL | Status: DC
  Administered 2014-10-11 – 2014-10-13 (×7): 600 mg via ORAL
  Filled 2014-10-11 (×8): qty 1

## 2014-10-11 MED ORDER — PRENATAL MULTIVITAMIN CH
1.0000 | ORAL_TABLET | Freq: Every day | ORAL | Status: DC
  Administered 2014-10-12 – 2014-10-13 (×2): 1 via ORAL
  Filled 2014-10-11 (×2): qty 1

## 2014-10-11 MED ORDER — LIDOCAINE HCL (PF) 1 % IJ SOLN
INTRAMUSCULAR | Status: DC | PRN
  Administered 2014-10-11: 5 mL via EPIDURAL
  Administered 2014-10-11: 4 mL via EPIDURAL

## 2014-10-11 MED ORDER — ONDANSETRON HCL 4 MG PO TABS: 4.0000 mg | ORAL_TABLET | ORAL | Status: DC | PRN

## 2014-10-11 MED ORDER — WITCH HAZEL-GLYCERIN EX PADS: 1.0000 "application " | MEDICATED_PAD | CUTANEOUS | Status: DC | PRN

## 2014-10-11 MED ORDER — PHENYLEPHRINE 40 MCG/ML (10ML) SYRINGE FOR IV PUSH (FOR BLOOD PRESSURE SUPPORT)
80.0000 ug | PREFILLED_SYRINGE | INTRAVENOUS | Status: DC | PRN
  Filled 2014-10-11: qty 20

## 2014-10-11 MED ORDER — SIMETHICONE 80 MG PO CHEW
80.0000 mg | CHEWABLE_TABLET | ORAL | Status: DC | PRN
  Filled 2014-10-11: qty 1

## 2014-10-11 MED ORDER — ZOLPIDEM TARTRATE 5 MG PO TABS: 5.0000 mg | ORAL_TABLET | Freq: Every evening | ORAL | Status: DC | PRN

## 2014-10-11 MED ORDER — BENZOCAINE-MENTHOL 20-0.5 % EX AERO: 1.0000 "application " | INHALATION_SPRAY | CUTANEOUS | Status: DC | PRN

## 2014-10-11 MED ORDER — ONDANSETRON HCL 4 MG/2ML IJ SOLN: 4.0000 mg | INTRAMUSCULAR | Status: DC | PRN

## 2014-10-11 MED ORDER — DIPHENHYDRAMINE HCL 25 MG PO CAPS: 25.0000 mg | ORAL_CAPSULE | Freq: Four times a day (QID) | ORAL | Status: DC | PRN

## 2014-10-11 MED ORDER — ACETAMINOPHEN 325 MG PO TABS: 650.0000 mg | ORAL_TABLET | ORAL | Status: DC | PRN

## 2014-10-11 MED ORDER — FENTANYL 2.5 MCG/ML BUPIVACAINE 1/10 % EPIDURAL INFUSION (WH - ANES)
14.0000 mL/h | INTRAMUSCULAR | Status: DC | PRN
  Administered 2014-10-11: 14 mL/h via EPIDURAL
  Administered 2014-10-11: 15 mL/h via EPIDURAL
  Filled 2014-10-11: qty 125

## 2014-10-11 NOTE — Progress Notes (Signed)
LABOR PROGRESS NOTE  Mira Balon Rage is a 29 y.o. N5A2130 at [redacted]w[redacted]d  admitted for preclampsia w/o severe features.  Subjective: No ha, vision change, sob, or ruq pain.  Objective: BP 131/70 mmHg  Pulse 105  Temp(Src) 97.8 F (36.6 C) (Oral)  Resp 18  Ht  (1.702 m)  Wt 297 lb (134.718 kg)  BMI 46.51 kg/m2  LMP 12/26/2013 (Exact Date) or  Filed Vitals:   10/11/14 1101 10/11/14 1103 10/11/14 1130 10/11/14 1132  BP:  127/68  131/70  Pulse:  95  105  Temp:      TempSrc:      Resp: Height:      Weight:        3.5/50/-3 150/mod/+a/-d  Labs: Lab Results  Component Value Date   WBC 11.3* 10/09/2014   HGB 13.5 10/09/2014   HCT 38.9 10/09/2014   MCV 86.4 10/09/2014   PLT 361 10/09/2014    Patient Active Problem List   Diagnosis Date Noted  . Mild pre-eclampsia 10/09/2014  . Group B Streptococcus carrier, +RV culture, currently pregnant 09/30/2014  . Maternal PPD positive 06/27/2014  . Language barrier 05/30/2014  . Sciatic nerve pain 05/09/2014  . Maternal morbid obesity, antepartum   . Supervision of normal pregnancy 03/14/2014    Assessment / Plan: 29 y.o. G4P1021 at [redacted]w[redacted]d here for preeclampsia w/o severe features (proteinuria).  Labor: now s/p arom, continuing pitocin PreE: no severe-range BPs, asymptomatic. Will continue daily preE labs Fetal Wellbeing:  Cat 1 Pain Control:  Iv narcotics Anticipated MOD:  vaginal  Silvano Bilis, MD 10/11/2014, 12:08 PM

## 2014-10-11 NOTE — Anesthesia Preprocedure Evaluation (Signed)
Anesthesia Evaluation  Patient identified by MRN, date of birth, ID band Patient awake    Reviewed: Allergy & Precautions, Patient's Chart, lab work & pertinent test results  Airway Mallampati: III  TM Distance: >3 FB Neck ROM: Full    Dental no notable dental hx. (+) Teeth Intact   Pulmonary neg pulmonary ROS,    Pulmonary exam normal breath sounds clear to auscultation       Cardiovascular hypertension, Normal cardiovascular exam Rhythm:Regular Rate:Normal     Neuro/Psych  Neuromuscular disease negative psych ROS   GI/Hepatic Neg liver ROS, GERD  ,  Endo/Other  Morbid obesity  Renal/GU Renal diseaseHx/o renal calculi  negative genitourinary   Musculoskeletal negative musculoskeletal ROS (+)   Abdominal (+) + obese,   Peds  Hematology negative hematology ROS (+)   Anesthesia Other Findings   Reproductive/Obstetrics (+) Pregnancy                             Anesthesia Physical Anesthesia Plan  ASA: III  Anesthesia Plan: Epidural   Post-op Pain Management:    Induction:   Airway Management Planned: Natural Airway  Additional Equipment:   Intra-op Plan:   Post-operative Plan: Extubation in OR  Informed Consent: I have reviewed the patients History and Physical, chart, labs and discussed the procedure including the risks, benefits and alternatives for the proposed anesthesia with the patient or authorized representative who has indicated his/her understanding and acceptance.   Dental advisory given  Plan Discussed with: Anesthesiologist  Anesthesia Plan Comments:         Anesthesia Quick Evaluation

## 2014-10-11 NOTE — Progress Notes (Signed)
.  LANGUAGE LINE ACCESSED TO EXPLAIN PROCEDURE TO PT

## 2014-10-11 NOTE — Progress Notes (Signed)
.  language line assisted with for discussion with patient

## 2014-10-11 NOTE — Procedures (Signed)
Preoperative diagnosis pregnancy 39 weeks persistent frank breech presentation Postoperative diagnosis: Pregnancy 39 weeks cephalic presentation Indications year old multiparous patient with ultrasound confirmed persistent breech presentation scheduled for induction due to gestational hypertension/preeclampsia Details of procedure: Patient was counseled by Dr. Alvester Morin and consented. Tocolytics terbutaline was administered 2 and ultrasound confirmed fetal malpresentation. Dr. Alvester Morin first used the forward roll technique trying to rotate the baby counterclockwise breech presentation. 2 attempts were made without success. I was able on the third attempt, to gradually rotate the baby counterclockwise using a rocking motion, with ultrasound intermittent guidance used to confirm that rotation was being effective and the fetal heart remained in the normal range. Rate was documented at completion of the procedure. Patient was without discomfort or bleeding abdominal binder was placed and cervical ripening with Cytotec will be begun

## 2014-10-11 NOTE — Progress Notes (Signed)
LANGUAGE LINE ACCESSED TO EXPLAIN TO PT PROCEDURE FOR AROM

## 2014-10-11 NOTE — Progress Notes (Signed)
   Kellyanne Diriye Rage is a 29 y.o. [id number] at [redacted]w[redacted]d  admitted for induction of labor due to Pre-eclamptic toxemia of pregnancy..  Subjective: No HA/b;urred vision/RUQ pain  Objective: Filed Vitals:   10/11/14 0205 10/11/14 0312 10/11/14 0420 10/11/14 0554  BP: 136/99 127/53 144/84 134/79  Pulse: 106 100 107 108  Temp: 97.9 F (36.6 C)     TempSrc: Oral     Resp: Height:      Weight:          FHT:  FHR: 145 bpm, variability: moderate,  accelerations:  Present,  decelerations:  Absent UC:   none SVE:   Dilation: 3 Effacement (%): 50 Station: -3 Exam by:: Mikle Bosworth RN Pitocin @ 4 mu/min  Labs: Lab Results  Component Value Date   WBC 11.3* 10/09/2014   HGB 13.5 10/09/2014   HCT 38.9 10/09/2014   MCV 86.4 10/09/2014   PLT 361 10/09/2014    Assessment / Plan: IOL, ripening phase .  Pit restarted after 2 more doses of cytotoec  Labor: Progressing normally Fetal Wellbeing:  Category I Pain Control:  Labor support without medications Anticipated MOD:  NSVD  CRESENZO-DISHMAN,FRANCES 10/11/2014, 6:54 AM

## 2014-10-11 NOTE — Progress Notes (Signed)
LANGUAGE LINE ACCESSED TO EXPLAIN TO PT PROCEDURE FOR AROM TO FACILITATE LABOR WITH POSSIBLE COMPLICATION OF C SECTION

## 2014-10-11 NOTE — Anesthesia Procedure Notes (Signed)
Epidural Patient location during procedure: OB Start time: 10/11/2014 2:32 PM  Staffing Anesthesiologist: Mal Amabile Performed by: anesthesiologist   Preanesthetic Checklist Completed: patient identified, site marked, surgical consent, pre-op evaluation, timeout performed, IV checked, risks and benefits discussed and monitors and equipment checked  Epidural Patient position: sitting Prep: site prepped and draped and DuraPrep Patient monitoring: continuous pulse ox and blood pressure Approach: midline Location: L4-L5 Injection technique: LOR air  Needle:  Needle type: Tuohy  Needle gauge: 17 G Needle length: 9 cm and 9 Needle insertion depth: 8 cm Catheter type: closed end flexible Catheter size: 19 Gauge Catheter at skin depth: 13 cm Test dose: negative and Other  Assessment Events: blood not aspirated, injection not painful, no injection resistance, negative IV test and no paresthesia  Additional Notes Patient identified. Risks and benefits discussed including failed block, incomplete  Pain control, post dural puncture headache, nerve damage, paralysis, blood pressure Changes, nausea, vomiting, reactions to medications-both toxic and allergic and post Partum back pain. All questions were answered. Patient expressed understanding and wished to proceed. Sterile technique was used throughout procedure. Epidural site was Dressed with sterile barrier dressing. No paresthesias, signs of intravascular injection Or signs of intrathecal spread were encountered. [place] interpreter used via phone during procedure. Patient was more comfortable after the epidural was dosed. Please see RN's note for documentation of vital signs and FHR which are stable.

## 2014-10-12 NOTE — Progress Notes (Signed)
Post Partum Day 1 Subjective: Eating, drinking, voiding, ambulating well.  +flatus.  Lochia and pain wnl.  Denies dizziness, lightheadedness, or sob. No complaints. Denies ha, scotomata, ruq/epigastric pain, n/v.    Objective: Blood pressure 131/79, pulse 101, temperature 98.1 F (36.7 C), temperature source Oral, resp. rate 19, height  (1.702 m), weight 134.718 kg (297 lb), last menstrual period 12/26/2013, SpO2 100 %, unknown if currently breastfeeding.  Physical Exam:  General: alert, cooperative and no distress Lochia: appropriate Uterine Fundus: firm Incision: n/a DVT Evaluation: No evidence of DVT seen on physical exam. Negative Homan's sign. No cords or calf tenderness. No significant calf/ankle edema.   Recent Labs  10/09/14 1423 10/11/14 1510  HGB 13.5 12.5  HCT 38.9 35.3*    Assessment/Plan: Plan for discharge tomorrow, Breastfeeding and Contraception undecided   LOS: 3 days   Marge Duncans 10/12/2014, 9:43 AM

## 2014-10-12 NOTE — Lactation Note (Signed)
This note was copied from the chart of Kelli Adryana Diriye Russell. Lactation Consultation Note Initial visit at 25 hours of age.  Mom requests somali interpreter and provided via [employer] phone line.  Mom has experience with older child breastfeeding for 3 years.  Mom pinched nipple to show how milk is squirting easily from breast.  LC demonstrated proper hand expression.  Mom reports cramping and nipple pain with latch.  Nipples are everted and appear WNL.   Encouraged mom to allow baby to open mouth wide for a deep latch.  Mom reports she knows how to feed a baby.  Discussed feeding frequency with feeding cues.  Mom prefers to feed by the clock every 2-3 hours.  Cluster feeding briefly mentioned.  Discussed having 8 or more feedings in 24 hours.  [facility] LC resources given and discussed.  Encouraged to feed with early cues on demand.  [facility]BU RN at bedside, report given.  Mom to call for assist as needed.    Patient Name: Kelli Russell Today's Date: 10/12/2014 Reason for consult: Initial assessment   Maternal Data Has patient been taught Hand Expression?: Yes Does the patient have breastfeeding experience prior to this delivery?: Yes  Feeding Feeding Type: Breast Fed Length of feed: 15 min  LATCH Score/Interventions                      Lactation Tools Discussed/Used     Consult Status Consult Status: Follow-up Date: 10/13/14 Follow-up type: In-patient    Shoptaw, Arvella Merles 10/12/2014, 5:20 PM

## 2014-10-12 NOTE — Anesthesia Postprocedure Evaluation (Signed)
  Anesthesia Post-op Note  Patient: Kelli Russell  Procedure(s) Performed: * No procedures listed *  Patient Location: PACU  Anesthesia Type:Epidural  Level of Consciousness: awake, alert  and oriented  Airway and Oxygen Therapy: Patient Spontanous Breathing  Post-op Pain: none  Post-op Assessment: Post-op Vital signs reviewed, Patient's Cardiovascular Status Stable, Respiratory Function Stable, Patent Airway, No signs of Nausea or vomiting and Pain level controlled              Post-op Vital Signs: Reviewed and stable  Last Vitals:  Filed Vitals:   10/12/14 0632  BP: 131/79  Pulse: 101  Temp: 36.7 C  Resp: 19    Complications: No apparent anesthesia complications

## 2014-10-13 MED ORDER — IBUPROFEN 600 MG PO TABS: 600.0000 mg | ORAL_TABLET | Freq: Four times a day (QID) | ORAL | Status: DC | PRN

## 2014-10-13 NOTE — Discharge Summary (Signed)
OB Discharge Summary  Patient Name: Kelli Russell DOB: 04/26/1985 MRN: [id number]  Date of admission: 10/09/2014 Delivering MD: Shonna Chock [place]   Date of discharge: 10/13/2014  Admitting diagnosis: 39WKS,ELEVATED BP Intrauterine pregnancy: [redacted]w[redacted]d     Secondary diagnosis: Preeclampsia and breech fetus     Discharge diagnosis: Term Pregnancy Delivered after successful ECV                                                                                                Post partum procedures:none  Augmentation: AROM, Pitocin and Cytotec  Complications: None  Hospital course:  Induction of Labor With Vaginal Delivery   29 y.o. yo [id number] at [redacted]w[redacted]d was admitted to the hospital 10/09/2014 for induction of labor.  Indication for induction: Preeclampsia.  Fetus was breech, successful ECV performed. Patient had an uncomplicated labor course as follows: Membrane Rupture Time/Date: 12:04 PM ,10/11/2014   Intrapartum Procedures: Episiotomy: None [1]                                         Lacerations:  2nd degree [3]  Patient had delivery of a Viable infant.  Information for the patient's newborn:  Diriye Russell, Girl Aleera [[id number]]  Delivery Method: Vaginal, Spontaneous Delivery (Filed from Delivery Summary)   10/11/2014  Details of delivery can be found in separate delivery note.  Patient had a routine postpartum course. Patient is discharged home No discharge date for patient encounter.    Physical exam  Filed Vitals:   10/11/14 2223 10/12/14 0632 10/12/14 1840 10/13/14 0728  BP: 101/48 131/79 127/79 114/64  Pulse: 105 101 87 79  Temp: 98 F (36.7 C) 98.1 F (36.7 C) 98.1 F (36.7 C) 98.3 F (36.8 C)  TempSrc: Oral Oral Oral Oral  Resp: Height:      Weight:      SpO2:   100%    General: alert, cooperative and no distress Lochia: appropriate Uterine Fundus: firm Incision: N/A DVT Evaluation: No evidence of DVT seen on physical exam. Negative Homan's  sign. No cords or calf tenderness. No significant calf/ankle edema. Labs: Lab Results  Component Value Date   WBC 10.7* 10/11/2014   HGB 12.5 10/11/2014   HCT 35.3* 10/11/2014   MCV 86.9 10/11/2014   PLT 342 10/11/2014   CMP Latest Ref Rng 10/09/2014  Glucose 65 - 99 mg/dL 92  BUN 6 - 20 mg/dL 6  Creatinine 4.69 - 6.29 mg/dL 5.28  Sodium 413 - 244 mmol/L 135  Potassium 3.5 - 5.1 mmol/L 4.4  Chloride 101 - 111 mmol/L 105  CO2 22 - 32 mmol/L 23  Calcium 8.9 - 10.3 mg/dL 9.2  Total Protein 6.1 - 8.1 g/dL -  Total Bilirubin 0.2 - 1.2 mg/dL -  Alkaline Phos 33 - 010 U/L -  AST 10 - 30 U/L -  ALT 6 - 29 U/L -    Discharge instruction: per After Visit Summary and "Baby and Me  Booklet".  Medications:  Current facility-administered medications:  .  acetaminophen (TYLENOL) tablet 650 mg, 650 mg, Oral, Q4H PRN, Durenda Hurt, MD .  benzocaine-Menthol (DERMOPLAST) 20-0.5 % topical spray 1 application, 1 application, Topical, PRN, Durenda Hurt, MD .  witch hazel-glycerin (TUCKS) pad 1 application, 1 application, Topical, PRN **AND** dibucaine (NUPERCAINAL) 1 % rectal ointment 1 application, 1 application, Rectal, PRN, Durenda Hurt, MD .  diphenhydrAMINE (BENADRYL) capsule 25 mg, 25 mg, Oral, Q6H PRN, Durenda Hurt, MD .  ibuprofen (ADVIL,MOTRIN) tablet 600 mg, 600 mg, Oral, 4 times per day, Durenda Hurt, MD, 600 mg at 10/13/14 0615 .  lanolin ointment, , Topical, PRN, Durenda Hurt, MD .  lidocaine (PF) (XYLOCAINE) 1 % injection 30 mL, 30 mL, Subcutaneous, PRN, Federico Flake, MD, 30 mL at 10/11/14 1611 .  ondansetron (ZOFRAN) tablet 4 mg, 4 mg, Oral, Q4H PRN **OR** ondansetron (ZOFRAN) injection 4 mg, 4 mg, Intravenous, Q4H PRN, Durenda Hurt, MD .  oxyCODONE-acetaminophen (PERCOCET/ROXICET) 5-325 MG per tablet 1 tablet, 1 tablet, Oral, Q4H PRN, Durenda Hurt, MD .  prenatal multivitamin tablet 1 tablet, 1 tablet, Oral, Q1200, Durenda Hurt, MD, 1  tablet at 10/12/14 1257 .  senna-docusate (Senokot-S) tablet 2 tablet, 2 tablet, Oral, Q24H, Durenda Hurt, MD, 2 tablet at 10/12/14 2312 .  simethicone (MYLICON) chewable tablet 80 mg, 80 mg, Oral, PRN, Durenda Hurt, MD .  zolpidem (AMBIEN) tablet 5 mg, 5 mg, Oral, QHS PRN, Durenda Hurt, MD  Diet: routine diet  Activity: Advance as tolerated. Pelvic rest for 6 weeks.   Outpatient follow up:6 weeks  Postpartum contraception: Undecided  Newborn Data: Live born female  Birth Weight: 7 lb 10.9 oz (3485 g) APGAR: 9, 9  Baby Feeding: Breast Disposition:home with mother   10/13/2014 Marge Duncans, CNM

## 2014-10-13 NOTE — Discharge Instructions (Signed)
Postpartum Care After Vaginal Delivery  °After you deliver your newborn (postpartum period), the usual stay in the hospital is 24-72 hours. If there were problems with your labor or delivery, or if you have other medical problems, you might be in the hospital longer.  °While you are in the hospital, you will receive help and instructions on how to care for yourself and your newborn during the postpartum period.  °While you are in the hospital:  °Be sure to tell your nurses if you have pain or discomfort, as well as where you feel the pain and what makes the pain worse.  °If you had an incision made near your vagina (episiotomy) or if you had some tearing during delivery, the nurses may put ice packs on your episiotomy or tear. The ice packs may help to reduce the pain and swelling.  °If you are breastfeeding, you may feel uncomfortable contractions of your uterus for a couple of weeks. This is normal. The contractions help your uterus get back to normal size.  °It is normal to have some bleeding after delivery.  °For the first 1-3 days after delivery, the flow is red and the amount may be similar to a period.  °It is common for the flow to start and stop.  °In the first few days, you may pass some small clots. Let your nurses know if you begin to pass large clots or your flow increases.  °Do not flush blood clots down the toilet before having the nurse look at them.  °During the next 3-10 days after delivery, your flow should become more watery and pink or brown-tinged in color.  °Ten to fourteen days after delivery, your flow should be a small amount of yellowish-white discharge.  °The amount of your flow will decrease over the first few weeks after delivery. Your flow may stop in 6-8 weeks. Most women have had their flow stop by 12 weeks after delivery. °You should change your sanitary pads frequently.  °Wash your hands thoroughly with soap and water for at least 20 seconds after changing pads, using the toilet,  or before holding or feeding your newborn.  °You should feel like you need to empty your bladder within the first 6-8 hours after delivery.  °In case you become weak, lightheaded, or faint, call your nurse before you get out of bed for the first time and before you take a shower for the first time.  °Within the first few days after delivery, your breasts may begin to feel tender and full. This is called engorgement. Breast tenderness usually goes away within 48-72 hours after engorgement occurs. You may also notice milk leaking from your breasts. If you are not breastfeeding, do not stimulate your breasts. Breast stimulation can make your breasts produce more milk.  °Spending as much time as possible with your newborn is very important. During this time, you and your newborn can feel close and get to know each other. Having your newborn stay in your room (rooming in) will help to strengthen the bond with your newborn. It will give you time to get to know your newborn and become comfortable caring for your newborn.  °Your hormones change after delivery. Sometimes the hormone changes can temporarily cause you to feel sad or tearful. These feelings should not last more than a few days. If these feelings last longer than that, you should talk to your caregiver.  °If desired, talk to your caregiver about methods of family planning or contraception.  °  Talk to your caregiver about immunizations. Your caregiver may want you to have the following immunizations before leaving the hospital:  °Tetanus, diphtheria, and pertussis (Tdap) or tetanus and diphtheria (Td) immunization. It is very important that you and your family (including grandparents) or others caring for your newborn are up-to-date with the Tdap or Td immunizations. The Tdap or Td immunization can help protect your newborn from getting ill.  °Rubella immunization.  °Varicella (chickenpox) immunization.  °Influenza immunization. You should receive this annual  immunization if you did not receive the immunization during your pregnancy. °Document Released: 10/25/2006 Document Revised: 09/22/2011 Document Reviewed: 08/25/2011  °ExitCare® Patient Information ©2015 [employer]. This information is not intended to replace advice given to you by your health care provider. Make sure you discuss any questions you have with your health care provider.  ° °Breastfeeding °Deciding to breastfeed is one of the best choices you can make for you and your baby. A change in hormones during pregnancy causes your breast tissue to grow and increases the number and size of your milk ducts. These hormones also allow proteins, sugars, and fats from your blood supply to make breast milk in your milk-producing glands. Hormones prevent breast milk from being released before your baby is born as well as prompt milk flow after birth. Once breastfeeding has begun, thoughts of your baby, as well as his or her sucking or crying, can stimulate the release of milk from your milk-producing glands.  °BENEFITS OF BREASTFEEDING °For Your Baby °· Your first milk (colostrum) helps your baby's digestive system function better.   °· There are antibodies in your milk that help your baby fight off infections.   °· Your baby has a lower incidence of asthma, allergies, and sudden infant death syndrome.   °· The nutrients in breast milk are better for your baby than infant formulas and are designed uniquely for your baby's needs.   °· Breast milk improves your baby's brain development.   °· Your baby is less likely to develop other conditions, such as childhood obesity, asthma, or type 2 diabetes mellitus.   °For You  °· Breastfeeding helps to create a very special bond between you and your baby.   °· Breastfeeding is convenient. Breast milk is always available at the correct temperature and costs nothing.   °· Breastfeeding helps to burn calories and helps you lose the weight gained during pregnancy.    °· Breastfeeding makes your uterus contract to its prepregnancy size faster and slows bleeding (lochia) after you give birth.   °· Breastfeeding helps to lower your risk of developing type 2 diabetes mellitus, osteoporosis, and breast or ovarian cancer later in life. °SIGNS THAT YOUR BABY IS HUNGRY °Early Signs of Hunger  °· Increased alertness or activity. °· Stretching. °· Movement of the head from side to side. °· Movement of the head and opening of the mouth when the corner of the mouth or cheek is stroked (rooting). °· Increased sucking sounds, smacking lips, cooing, sighing, or squeaking. °· Hand-to-mouth movements. °· Increased sucking of fingers or hands. °Late Signs of Hunger °· Fussing. °· Intermittent crying. °Extreme Signs of Hunger °Signs of extreme hunger will require calming and consoling before your baby will be able to breastfeed successfully. Do not wait for the following signs of extreme hunger to occur before you initiate breastfeeding:   °· Restlessness. °· A loud, strong cry. °·  Screaming. °BREASTFEEDING BASICS °Breastfeeding Initiation °· Find a comfortable place to sit or lie down, with your neck and back well supported. °· Place a pillow or   rolled up blanket under your baby to bring him or her to the level of your breast (if you are seated). Nursing pillows are specially designed to help support your arms and your baby while you breastfeed. °· Make sure that your baby's abdomen is facing your abdomen.   °· Gently massage your breast. With your fingertips, massage from your chest wall toward your nipple in a circular motion. This encourages milk flow. You may need to continue this action during the feeding if your milk flows slowly. °· Support your breast with 4 fingers underneath and your thumb above your nipple. Make sure your fingers are well away from your nipple and your baby's mouth.   °· Stroke your baby's lips gently with your finger or nipple.   °· When your baby's mouth is open  wide enough, quickly bring your baby to your breast, placing your entire nipple and as much of the colored area around your nipple (areola) as possible into your baby's mouth.   °¨ More areola should be visible above your baby's upper lip than below the lower lip.   °¨ Your baby's tongue should be between his or her lower gum and your breast.   °· Ensure that your baby's mouth is correctly positioned around your nipple (latched). Your baby's lips should create a seal on your breast and be turned out (everted). °· It is common for your baby to suck about 2-3 minutes in order to start the flow of breast milk. °Latching °Teaching your baby how to latch on to your breast properly is very important. An improper latch can cause nipple pain and decreased milk supply for you and poor weight gain in your baby. Also, if your baby is not latched onto your nipple properly, he or she may swallow some air during feeding. This can make your baby fussy. Burping your baby when you switch breasts during the feeding can help to get rid of the air. However, teaching your baby to latch on properly is still the best way to prevent fussiness from swallowing air while breastfeeding. °Signs that your baby has successfully latched on to your nipple:    °· Silent tugging or silent sucking, without causing you pain.   °· Swallowing heard between every 3-4 sucks.   °·  Muscle movement above and in front of his or her ears while sucking.   °Signs that your baby has not successfully latched on to nipple:  °· Sucking sounds or smacking sounds from your baby while breastfeeding. °· Nipple pain. °If you think your baby has not latched on correctly, slip your finger into the corner of your baby's mouth to break the suction and place it between your baby's gums. Attempt breastfeeding initiation again. °Signs of Successful Breastfeeding °Signs from your baby:   °· A gradual decrease in the number of sucks or complete cessation of sucking.   °· Falling  asleep.   °· Relaxation of his or her body.   °· Retention of a small amount of milk in his or her mouth.   °· Letting go of your breast by himself or herself. °Signs from you: °· Breasts that have increased in firmness, weight, and size 1-3 hours after feeding.   °· Breasts that are softer immediately after breastfeeding. °· Increased milk volume, as well as a change in milk consistency and color by the fifth day of breastfeeding.   °· Nipples that are not sore, cracked, or bleeding. °Signs That Your Baby is Getting Enough Milk °· Wetting at least 3 diapers in a 24-hour period. The urine should be clear and   pale yellow by age 5 days. °· At least 3 stools in a 24-hour period by age 5 days. The stool should be soft and yellow. °· At least 3 stools in a 24-hour period by age 7 days. The stool should be seedy and yellow. °· No loss of weight greater than 10% of birth weight during the first 3 days of age. °· Average weight gain of 4-7 ounces (113-198 g) per week after age 4 days. °· Consistent daily weight gain by age 5 days, without weight loss after the age of 2 weeks. °After a feeding, your baby may spit up a small amount. This is common. °BREASTFEEDING FREQUENCY AND DURATION °Frequent feeding will help you make more milk and can prevent sore nipples and breast engorgement. Breastfeed when you feel the need to reduce the fullness of your breasts or when your baby shows signs of hunger. This is called "breastfeeding on demand." Avoid introducing a pacifier to your baby while you are working to establish breastfeeding (the first 4-6 weeks after your baby is born). After this time you may choose to use a pacifier. Research has shown that pacifier use during the first year of a baby's life decreases the risk of sudden infant death syndrome (SIDS). °Allow your baby to feed on each breast as long as he or she wants. Breastfeed until your baby is finished feeding. When your baby unlatches or falls asleep while feeding from  the first breast, offer the second breast. Because newborns are often sleepy in the first few weeks of life, you may need to awaken your baby to get him or her to feed. °Breastfeeding times will vary from baby to baby. However, the following rules can serve as a guide to help you ensure that your baby is properly fed: °· Newborns (babies 4 weeks of age or younger) may breastfeed every 1-3 hours. °· Newborns should not go longer than 3 hours during the day or 5 hours during the night without breastfeeding. °· You should breastfeed your baby a minimum of 8 times in a 24-hour period until you begin to introduce solid foods to your baby at around 6 months of age. °BREAST MILK PUMPING °Pumping and storing breast milk allows you to ensure that your baby is exclusively fed your breast milk, even at times when you are unable to breastfeed. This is especially important if you are going back to work while you are still breastfeeding or when you are not able to be present during feedings. Your lactation consultant can give you guidelines on how long it is safe to store breast milk.  °A breast pump is a machine that allows you to pump milk from your breast into a sterile bottle. The pumped breast milk can then be stored in a refrigerator or freezer. Some breast pumps are operated by hand, while others use electricity. Ask your lactation consultant which type will work best for you. Breast pumps can be purchased, but some hospitals and breastfeeding support groups lease breast pumps on a monthly basis. A lactation consultant can teach you how to hand express breast milk, if you prefer not to use a pump.  °CARING FOR YOUR BREASTS WHILE YOU BREASTFEED °Nipples can become dry, cracked, and sore while breastfeeding. The following recommendations can help keep your breasts moisturized and healthy: °· Avoid using soap on your nipples.   °· Wear a supportive bra. Although not required, special nursing bras and tank tops are designed to  allow access to your breasts for   breastfeeding without taking off your entire bra or top. Avoid wearing underwire-style bras or extremely tight bras. °· Air dry your nipples for 3-4 minutes after each feeding.   °· Use only cotton bra pads to absorb leaked breast milk. Leaking of breast milk between feedings is normal.   °· Use lanolin on your nipples after breastfeeding. Lanolin helps to maintain your skin's normal moisture barrier. If you use pure lanolin, you do not need to wash it off before feeding your baby again. Pure lanolin is not toxic to your baby. You may also hand express a few drops of breast milk and gently massage that milk into your nipples and allow the milk to air dry. °In the first few weeks after giving birth, some women experience extremely full breasts (engorgement). Engorgement can make your breasts feel heavy, warm, and tender to the touch. Engorgement peaks within 3-5 days after you give birth. The following recommendations can help ease engorgement: °· Completely empty your breasts while breastfeeding or pumping. You may want to start by applying warm, moist heat (in the shower or with warm water-soaked hand towels) just before feeding or pumping. This increases circulation and helps the milk flow. If your baby does not completely empty your breasts while breastfeeding, pump any extra milk after he or she is finished. °· Wear a snug bra (nursing or regular) or tank top for 1-2 days to signal your body to slightly decrease milk production. °· Apply ice packs to your breasts, unless this is too uncomfortable for you. °· Make sure that your baby is latched on and positioned properly while breastfeeding. °If engorgement persists after 48 hours of following these recommendations, contact your health care provider or a lactation consultant. °OVERALL HEALTH CARE RECOMMENDATIONS WHILE BREASTFEEDING °· Eat healthy foods. Alternate between meals and snacks, eating 3 of each per day. Because what you  eat affects your breast milk, some of the foods may make your baby more irritable than usual. Avoid eating these foods if you are sure that they are negatively affecting your baby. °· Drink milk, fruit juice, and water to satisfy your thirst (about 10 glasses a day).   °· Rest often, relax, and continue to take your prenatal vitamins to prevent fatigue, stress, and anemia. °· Continue breast self-awareness checks. °· Avoid chewing and smoking tobacco. °· Avoid alcohol and drug use. °Some medicines that may be harmful to your baby can pass through breast milk. It is important to ask your health care provider before taking any medicine, including all over-the-counter and prescription medicine as well as vitamin and herbal supplements. °It is possible to become pregnant while breastfeeding. If birth control is desired, ask your health care provider about options that will be safe for your baby. °SEEK MEDICAL CARE IF:  °· You feel like you want to stop breastfeeding or have become frustrated with breastfeeding. °· You have painful breasts or nipples. °· Your nipples are cracked or bleeding. °· Your breasts are red, tender, or warm. °· You have a swollen area on either breast. °· You have a fever or chills. °· You have nausea or vomiting. °· You have drainage other than breast milk from your nipples. °· Your breasts do not become full before feedings by the fifth day after you give birth. °· You feel sad and depressed. °· Your baby is too sleepy to eat well. °· Your baby is having trouble sleeping.   °· Your baby is wetting less than 3 diapers in a 24-hour period. °· Your baby has   less than 3 stools in a 24-hour period. °· Your baby's skin or the white part of his or her eyes becomes yellow.   °· Your baby is not gaining weight by 5 days of age. °SEEK IMMEDIATE MEDICAL CARE IF:  °· Your baby is overly tired (lethargic) and does not want to wake up and feed. °· Your baby develops an unexplained fever. °Document Released:  12/28/2004 Document Revised: 01/02/2013 Document Reviewed: 06/21/2012 °ExitCare® Patient Information ©2015 [employer]. This information is not intended to replace advice given to you by your health care provider. Make sure you discuss any questions you have with your health care provider. ° °

## 2014-10-14 ENCOUNTER — Encounter: Payer: Medicaid Other | Admitting: Obstetrics and Gynecology

## 2014-11-18 ENCOUNTER — Ambulatory Visit: Payer: Medicaid Other | Admitting: Obstetrics & Gynecology

## 2014-11-22 ENCOUNTER — Ambulatory Visit (INDEPENDENT_AMBULATORY_CARE_PROVIDER_SITE_OTHER): Payer: Medicaid Other | Admitting: Obstetrics & Gynecology

## 2014-11-22 ENCOUNTER — Encounter: Payer: Self-pay | Admitting: Obstetrics & Gynecology

## 2014-11-22 NOTE — Progress Notes (Signed)
Patient ID: Kelli Russell, female   DOB: 1985/07/27, 29 y.o.   MRN: [id number] Subjective:     Kelli Russell is a 29 y.o. female who presents for a postpartum visit. She is 6 weeks postpartum following a spontaneous vaginal delivery. I have fully reviewed the prenatal and intrapartum course. The delivery was at 39 gestational weeks. Outcome: spontaneous vaginal delivery. Anesthesia: epidural. Postpartum course has been complicated by intermittent headaches and dizziness. Baby's course has been unremarkable. Baby is feeding by breast. Bleeding no bleeding. Bowel function is normal. Bladder function is normal. Patient is not sexually active. Contraception method is none. Postpartum depression screening: negative.  [place] interpreter via [employer] interpreter line used.  The following portions of the patient's history were reviewed and updated as appropriate: allergies, current medications, past family history, past medical history, past social history, past surgical history and problem list.  Review of Systems Pertinent items are noted in HPI.   Objective:    BP 99/69 mmHg  Pulse 75  Temp(Src) 98 F (36.7 C)  Wt 286 lb 8 oz (129.956 kg)  Breastfeeding? Yes  General:  alert, cooperative and no distress   Breast    deferred        Pelvic deferred                           Assessment:     normal  postpartum exam. Pap smear done at today's visit.   Plan:    1. Contraception: condoms 2. report problem 3. Follow up as needed.    Adam PhenixJames G Arnold, MD 11/22/2014

## 2014-12-27 ENCOUNTER — Encounter: Payer: Self-pay | Admitting: *Deleted

## 2015-01-24 NOTE — Progress Notes (Signed)
Post discharge chart review completed.  

## 2015-08-04 ENCOUNTER — Ambulatory Visit: Payer: Medicaid Other

## 2015-08-06 ENCOUNTER — Ambulatory Visit: Payer: Medicaid Other | Attending: Internal Medicine

## 2015-09-25 ENCOUNTER — Other Ambulatory Visit (HOSPITAL_COMMUNITY): Payer: Self-pay | Admitting: Nurse Practitioner

## 2015-09-25 DIAGNOSIS — IMO0002 Reserved for concepts with insufficient information to code with codable children: Secondary | ICD-10-CM

## 2015-09-25 DIAGNOSIS — Z3A12 12 weeks gestation of pregnancy: Secondary | ICD-10-CM

## 2015-09-25 DIAGNOSIS — O351XX0 Maternal care for (suspected) chromosomal abnormality in fetus, not applicable or unspecified: Secondary | ICD-10-CM

## 2015-09-25 LAB — OB RESULTS CONSOLE RPR: RPR: NONREACTIVE

## 2015-09-25 LAB — OB RESULTS CONSOLE GC/CHLAMYDIA
Chlamydia: NEGATIVE
GC PROBE AMP, GENITAL: NEGATIVE

## 2015-09-25 LAB — OB RESULTS CONSOLE RUBELLA ANTIBODY, IGM: RUBELLA: IMMUNE

## 2015-09-25 LAB — OB RESULTS CONSOLE HIV ANTIBODY (ROUTINE TESTING): HIV: NONREACTIVE

## 2015-09-30 LAB — OB RESULTS CONSOLE HEPATITIS B SURFACE ANTIGEN: Hepatitis B Surface Ag: NEGATIVE

## 2015-10-09 ENCOUNTER — Encounter (HOSPITAL_COMMUNITY): Payer: Self-pay | Admitting: *Deleted

## 2015-10-10 ENCOUNTER — Ambulatory Visit (HOSPITAL_COMMUNITY)
Admission: RE | Admit: 2015-10-10 | Discharge: 2015-10-10 | Disposition: A | Discharge status: Home / Self Care | Payer: Medicaid Other | Source: Ambulatory Visit | Attending: Nurse Practitioner | Admitting: Nurse Practitioner

## 2015-10-10 ENCOUNTER — Encounter (HOSPITAL_COMMUNITY): Payer: Self-pay

## 2015-10-10 ENCOUNTER — Other Ambulatory Visit (HOSPITAL_COMMUNITY): Payer: Self-pay | Admitting: Nurse Practitioner

## 2015-10-10 DIAGNOSIS — Z369 Encounter for antenatal screening, unspecified: Secondary | ICD-10-CM

## 2015-10-10 DIAGNOSIS — O99211 Obesity complicating pregnancy, first trimester: Secondary | ICD-10-CM | POA: Insufficient documentation

## 2015-10-10 DIAGNOSIS — Z3A11 11 weeks gestation of pregnancy: Secondary | ICD-10-CM | POA: Diagnosis not present

## 2015-10-10 DIAGNOSIS — Z3A12 12 weeks gestation of pregnancy: Secondary | ICD-10-CM

## 2015-10-10 DIAGNOSIS — O351XX0 Maternal care for (suspected) chromosomal abnormality in fetus, not applicable or unspecified: Secondary | ICD-10-CM

## 2015-10-10 DIAGNOSIS — Z36 Encounter for antenatal screening of mother: Secondary | ICD-10-CM | POA: Diagnosis not present

## 2015-10-10 DIAGNOSIS — IMO0002 Reserved for concepts with insufficient information to code with codable children: Secondary | ICD-10-CM

## 2015-10-11 IMAGING — US US OB COMP +14 WK
1 series · 12 of 28 positions shown · non-contrast
Comparison: none

[Series 1: us ob comp +14 wk · 0.20mm/px · 71 acquisitions, 12 frames shown]
[im 3/71]
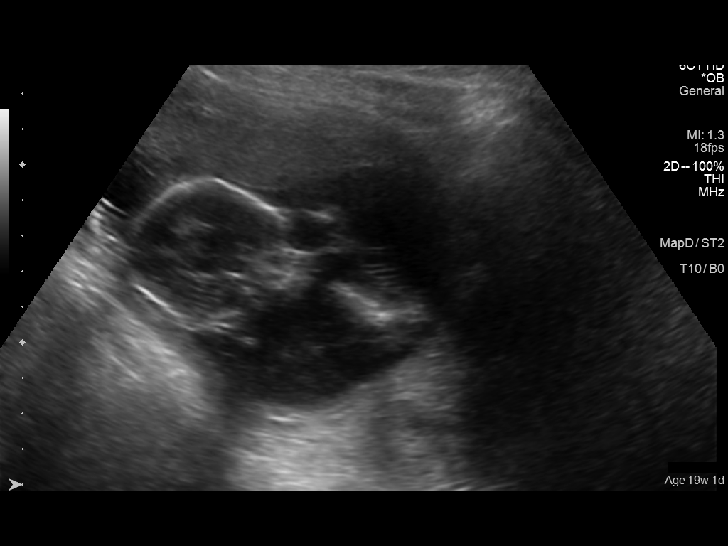
[im 8/71]
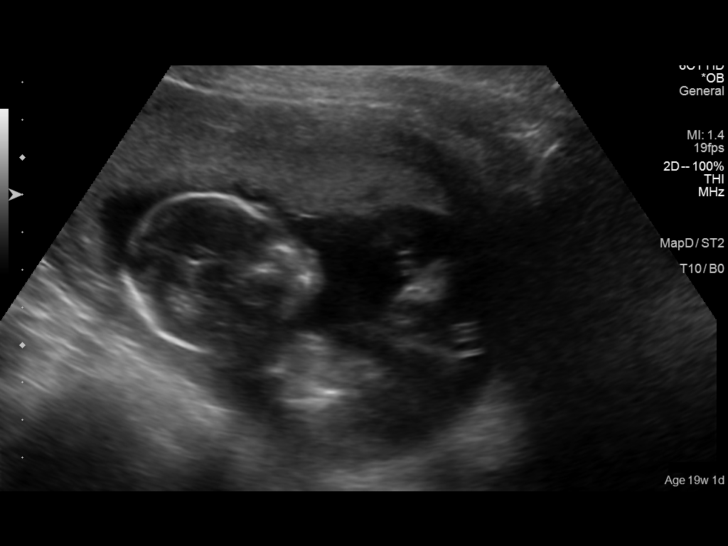
[im 13/71]
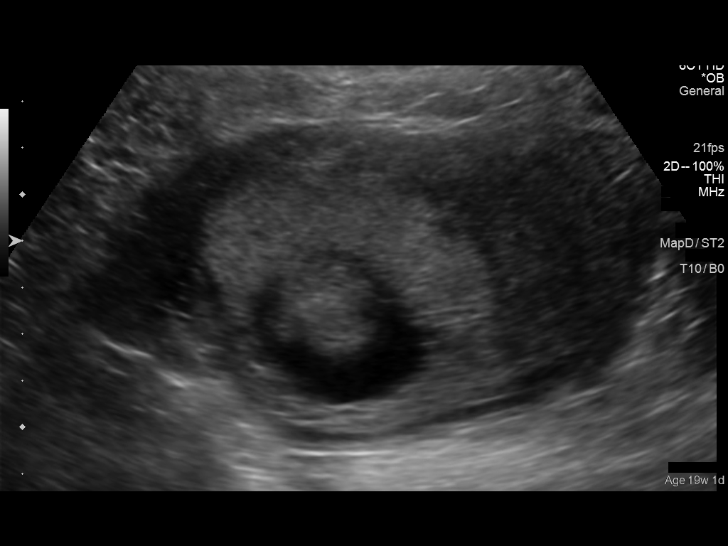
[im 21/71]
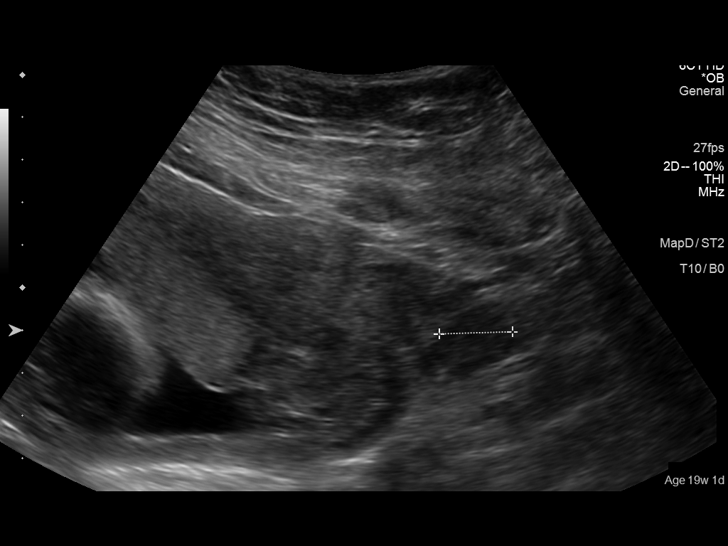
[im 26/71]
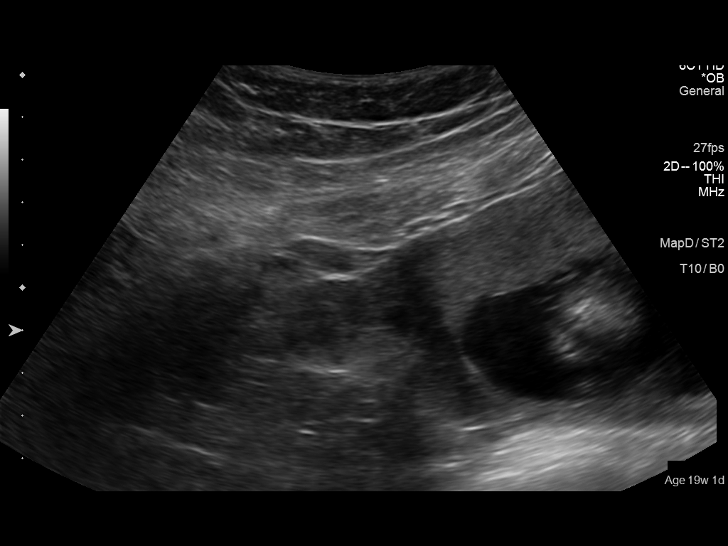
[im 32/71]
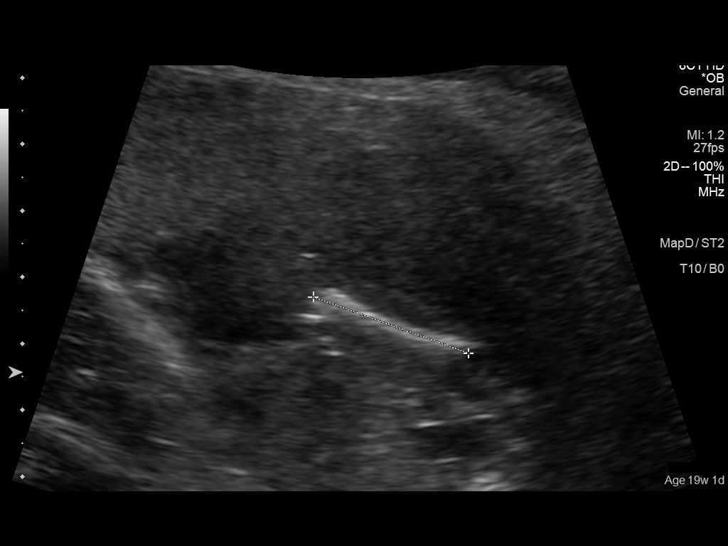
[im 39/71]
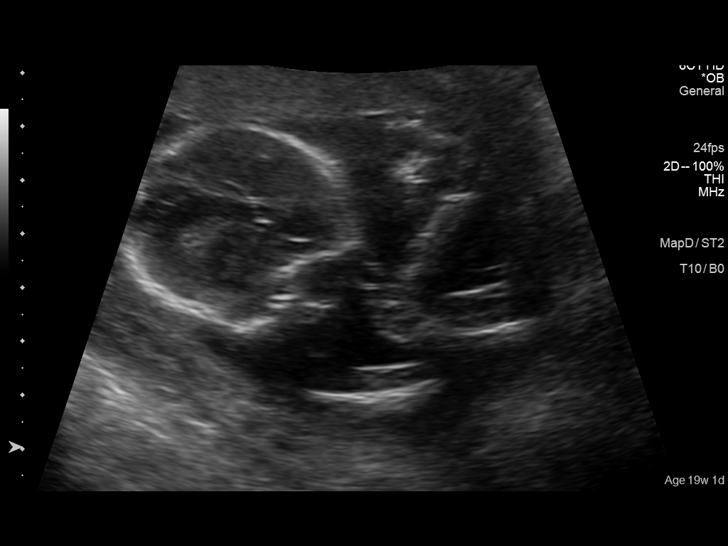
[im 45/71]
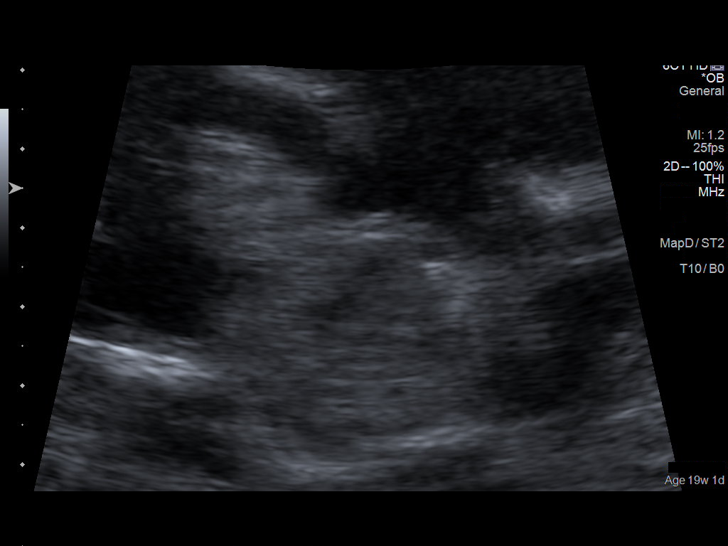
[im 50/71]
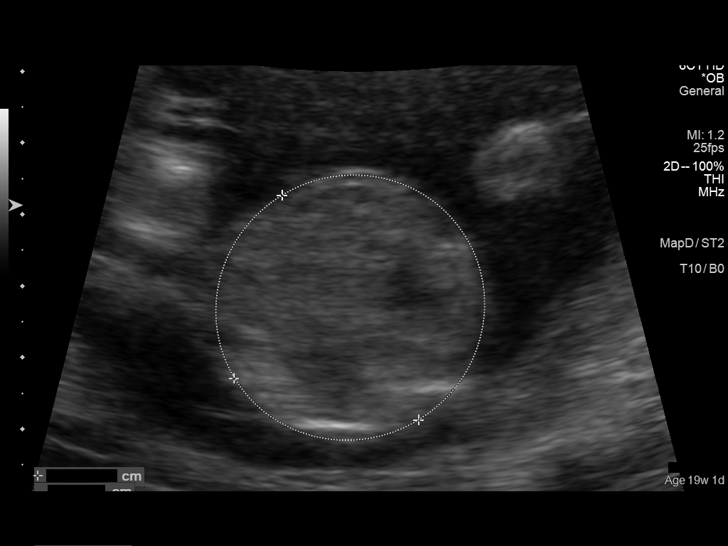
[im 58/71]
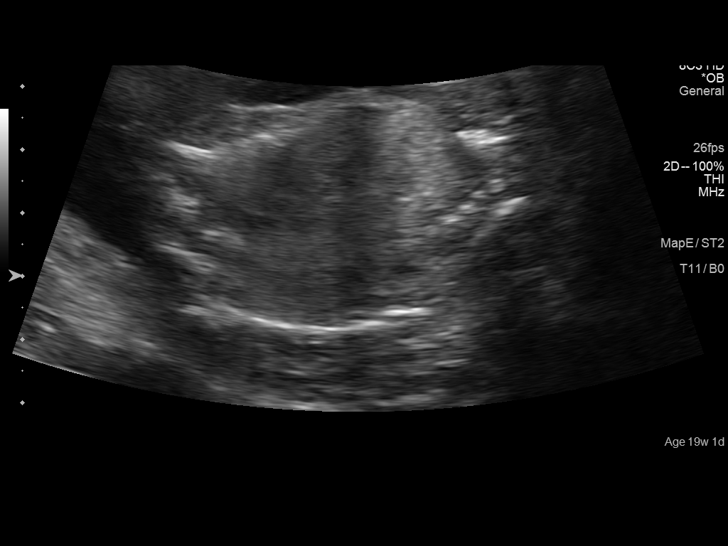
[im 63/71]
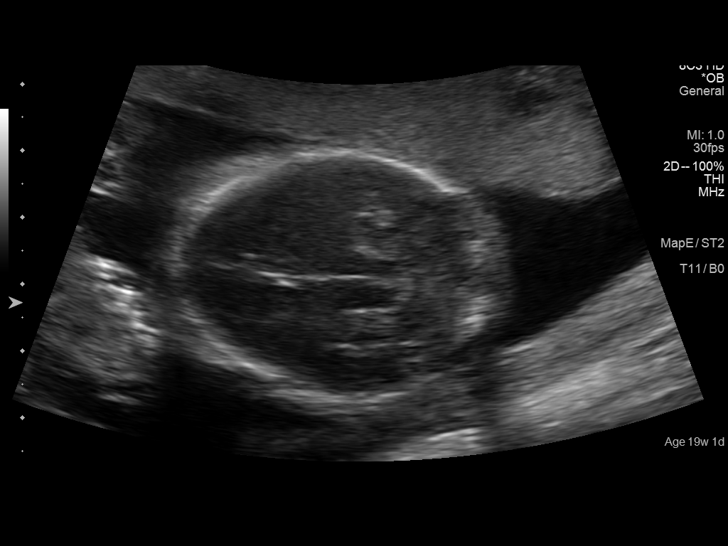
[im 68/71]
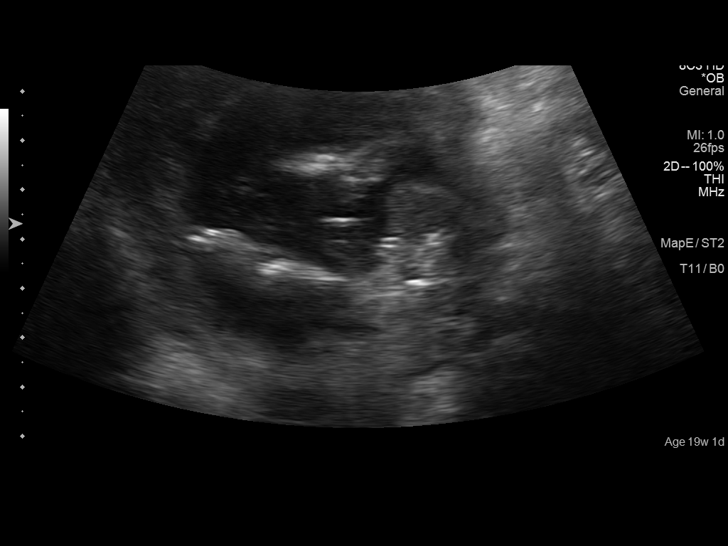

[12 of 28 positions shown; findings below may reference images not displayed]

OBSTETRICS REPORT

Service(s) Provided

US OB COMP + 14 WK                                    76805.1
Indications

Maternal morbid obesity
Basic anatomic survey                                 z36
17 weeks gestation of pregnancy
Fetal Evaluation

Num Of Fetuses:    1
Fetal Heart Rate:  143                          bpm
Cardiac Activity:  Observed
Presentation:      Breech
Placenta:          Anterior Fundal, above
cervical os
P. Cord            Visualized, central
Insertion:

Amniotic Fluid
AFI FV:      Subjectively within normal limits
Larg Pckt:    4.91  cm
Biometry

BPD:     36.4  mm     G. Age:  17w 1d                CI:        74.36   70 - 86
FL/HC:      17.8   14.6 -
17.6
HC:       134  mm     G. Age:  16w 6d       29  %    HC/AC:      1.15   1.07 -
1.29
AC:     116.5  mm     G. Age:  17w 3d       58  %    FL/BPD:
FL:      23.9  mm     G. Age:  17w 1d       47  %    FL/AC:      20.5   20 - 24
HUM:     23.1  mm     G. Age:  17w 1d       56  %

Est. FW:     188  gm      0 lb 7 oz     57  %
Gestational Age

LMP:           19w 1d        Date:  12/26/13                 EDD:   10/02/14
U/S Today:     17w 1d                                        EDD:   10/16/14
Best:          17w 1d     Det. By:  U/S (05/09/14)           EDD:   10/16/14
Anatomy
Cranium:          Appears normal         Aortic Arch:      Not well visualized
Fetal Cavum:      Not well visualized    Ductal Arch:      Not well visualized
Ventricles:       Not well visualized    Diaphragm:        Not well visualized
Choroid Plexus:   Appears normal         Stomach:          Appears normal, left
sided
Cerebellum:       Appears normal         Abdomen:          Not well visualized
Posterior Fossa:  Appears normal         Abdominal Wall:   Not well visualized
Nuchal Fold:      Not well visualized    Cord Vessels:     Appears normal (3
vessel cord)
Face:             Not well visualized    Kidneys:          Appear normal
Lips:             Not well visualized    Bladder:          Appears normal
Heart:            Not well visualized    Spine:            Not well visualized
RVOT:             Not well visualized    Lower             Visualized
Extremities:
LVOT:             Not well visualized    Upper             Visualized
Extremities:

Other:  Fetus appears to be a female. Nasal bone visualized. Technically
difficult due to maternal habitus and fetal position. Technically difficult
due to early gestational age.
Cervix Uterus Adnexa

Cervical Length:    3.5      cm

Cervix:       Normal appearance by transabdominal scan.
Uterus:       No abnormality visualized.
Cul De Sac:   No free fluid seen.

Left Ovary:    Within normal limits.
Right Ovary:   Within normal limits.
Adnexa:     No abnormality visualized.
Impression

SIUP at 17+1 weeks
No gross abnormalities identified
Markers of aneuploidy: none
Normal amniotic fluid volume
EDC based on today's measurements: 10/16/14
Recommendations

[facility] F/U US in 4-6 weeks with MFM due to morbid obesity

## 2015-10-13 ENCOUNTER — Other Ambulatory Visit: Payer: Self-pay | Admitting: Nurse Practitioner

## 2016-01-12 NOTE — L&D Delivery Note (Signed)
Delivery Note At 2:11 AM a viable female was delivered via Vaginal, Spontaneous Delivery (Presentation:LOA ). APGAR:8, 9; Weight 8 lb 6.6 oz (3816 g).   Placenta status: Delivered intact with gentle traction.  Cord: 3 vessels  with the following complications: None.  Cord pH: Not collected  Anesthesia: Epidural and IV pain meds Episiotomy: no   Lacerations:  Second degree Suture Repair: 3.0 vicryl Est. Blood Loss (mL): 500 ml    Mom to postpartum.  Baby to Couplet care / Skin to Skin.  Abdoulaye Diallo 04/27/2016, 4:09 AM  I certify that I was gloved and present for the entirety of the delivery and repair, and I agree with the above documentation and findings.  Luna Kitchens

## 2016-03-26 LAB — OB RESULTS CONSOLE GBS: STREP GROUP B AG: NEGATIVE

## 2016-04-11 ENCOUNTER — Inpatient Hospital Stay (HOSPITAL_COMMUNITY)
Admission: AD | Admit: 2016-04-11 | Discharge: 2016-04-11 | Disposition: A | Discharge status: Home / Self Care | Payer: Medicaid Other | Source: Ambulatory Visit | Attending: Obstetrics and Gynecology | Admitting: Obstetrics and Gynecology

## 2016-04-11 ENCOUNTER — Encounter (HOSPITAL_COMMUNITY): Payer: Self-pay

## 2016-04-11 DIAGNOSIS — Z3A00 Weeks of gestation of pregnancy not specified: Secondary | ICD-10-CM | POA: Insufficient documentation

## 2016-04-11 DIAGNOSIS — Z3490 Encounter for supervision of normal pregnancy, unspecified, unspecified trimester: Secondary | ICD-10-CM | POA: Diagnosis present

## 2016-04-11 DIAGNOSIS — O479 False labor, unspecified: Secondary | ICD-10-CM

## 2016-04-11 NOTE — MAU Note (Signed)
Patient interviewed using Somali interpreter ([employer]) presents with vaginal discharge this morning, feeling pressure, denies vaginal bleeding

## 2016-04-11 NOTE — MAU Note (Signed)
RN to the [facility] to provide care for patient and review discharge instructions, attempt to [place] [employer] Video Interpreting - connection made, but interpreter could only hear bits and pieces - bad connection.  Will attempt to use interpreter phone.

## 2016-04-11 NOTE — MAU Note (Addendum)
Telephone interpreter used - kept ringing, no answer.  Attempt [employer] Video Interpreter - bad connection but able to connect...in and out.  Labor precautions reviewed with patient and spouse, discharge instructions given, pt. signed via e-signature, copy given. Pt. Verbalized understanding. Pt. Discharged to home.  I have communicated with Darlene, CNM and reviewed vital signs:  Vitals:   04/11/16 1729 04/11/16 1907  BP: 138/86 96/67  Pulse: (!) 115 (!) 108  Resp: 18 20  Temp: 98.8 F (37.1 C) 98.1 F (36.7 C)    Vaginal exam:  Dilation: 2.5 Effacement (%): 70 Cervical Position: Middle Station: -3 Exam by:: Truda Staub, RN ,   Also reviewed contraction pattern and that non-stress test is reactive.  It has been documented that patient is contracting every 3-4 minutes with no cervical change over 1 hours not indicating active labor.  Patient denies any other complaints.  Based on this report provider has given order for discharge.  A discharge order and diagnosis entered by a provider.   Labor discharge instructions reviewed with patient.

## 2016-04-26 ENCOUNTER — Inpatient Hospital Stay (HOSPITAL_COMMUNITY): Payer: Medicaid Other | Admitting: Anesthesiology

## 2016-04-26 ENCOUNTER — Encounter (HOSPITAL_COMMUNITY): Payer: Self-pay | Admitting: *Deleted

## 2016-04-26 ENCOUNTER — Inpatient Hospital Stay (HOSPITAL_COMMUNITY)
Admission: AD | Admit: 2016-04-26 | Discharge: 2016-04-28 | DRG: 775 | Disposition: A | Discharge status: Home / Self Care | Payer: Medicaid Other | Source: Ambulatory Visit | Attending: Obstetrics and Gynecology | Admitting: Obstetrics and Gynecology

## 2016-04-26 DIAGNOSIS — O134 Gestational [pregnancy-induced] hypertension without significant proteinuria, complicating childbirth: Principal | ICD-10-CM | POA: Diagnosis present

## 2016-04-26 DIAGNOSIS — Z7982 Long term (current) use of aspirin: Secondary | ICD-10-CM

## 2016-04-26 DIAGNOSIS — O99214 Obesity complicating childbirth: Secondary | ICD-10-CM | POA: Diagnosis present

## 2016-04-26 DIAGNOSIS — Z6841 Body Mass Index (BMI) 40.0 and over, adult: Secondary | ICD-10-CM

## 2016-04-26 DIAGNOSIS — O480 Post-term pregnancy: Secondary | ICD-10-CM | POA: Diagnosis present

## 2016-04-26 DIAGNOSIS — O133 Gestational [pregnancy-induced] hypertension without significant proteinuria, third trimester: Secondary | ICD-10-CM | POA: Diagnosis not present

## 2016-04-26 DIAGNOSIS — Z3A40 40 weeks gestation of pregnancy: Secondary | ICD-10-CM | POA: Diagnosis not present

## 2016-04-26 HISTORY — DX: Gestational (pregnancy-induced) hypertension without significant proteinuria, unspecified trimester: O13.9

## 2016-04-26 LAB — PROTEIN / CREATININE RATIO, URINE
Creatinine, Urine: 43 mg/dL
Protein Creatinine Ratio: 0.19 mg/mg{Cre} — ABNORMAL HIGH (ref 0.00–0.15)
TOTAL PROTEIN, URINE: 8 mg/dL

## 2016-04-26 LAB — CBC
HCT: 40.3 % (ref 36.0–46.0)
HEMOGLOBIN: 13.7 g/dL (ref 12.0–15.0)
MCH: 30.2 pg (ref 26.0–34.0)
MCHC: 34 g/dL (ref 30.0–36.0)
MCV: 88.8 fL (ref 78.0–100.0)
PLATELETS: 310 10*3/uL (ref 150–400)
RBC: 4.54 MIL/uL (ref 3.87–5.11)
RDW: 14.1 % (ref 11.5–15.5)
WBC: 10.7 10*3/uL — AB (ref 4.0–10.5)

## 2016-04-26 LAB — COMPREHENSIVE METABOLIC PANEL
ALK PHOS: 128 U/L — AB (ref 38–126)
ALT: 9 U/L — AB (ref 14–54)
AST: 17 U/L (ref 15–41)
Albumin: 2.5 g/dL — ABNORMAL LOW (ref 3.5–5.0)
Anion gap: 7 (ref 5–15)
BUN: <5 mg/dL — ABNORMAL LOW (ref 6–20)
CALCIUM: 8.9 mg/dL (ref 8.9–10.3)
CHLORIDE: 108 mmol/L (ref 101–111)
CO2: 20 mmol/L — AB (ref 22–32)
CREATININE: 0.4 mg/dL — AB (ref 0.44–1.00)
GFR calc Af Amer: >60 mL/min (ref >60)
GFR calc non Af Amer: >60 mL/min (ref >60)
GLUCOSE: 103 mg/dL — AB (ref 65–99)
Potassium: 4.2 mmol/L (ref 3.5–5.1)
SODIUM: 135 mmol/L (ref 135–145)
Total Bilirubin: 0.5 mg/dL (ref 0.3–1.2)
Total Protein: 6.1 g/dL — ABNORMAL LOW (ref 6.5–8.1)

## 2016-04-26 LAB — TYPE AND SCREEN
ABO/RH(D): O POS
ANTIBODY SCREEN: NEGATIVE

## 2016-04-26 MED ORDER — OXYTOCIN 40 UNITS IN LACTATED RINGERS INFUSION - SIMPLE MED: 2.5000 [IU]/h | INTRAVENOUS | Status: DC

## 2016-04-26 MED ORDER — OXYTOCIN 40 UNITS IN LACTATED RINGERS INFUSION - SIMPLE MED
2.5000 [IU]/h | INTRAVENOUS | Status: DC
  Administered 2016-04-27: 2.5 [IU]/h via INTRAVENOUS

## 2016-04-26 MED ORDER — DIPHENHYDRAMINE HCL 50 MG/ML IJ SOLN: 12.5000 mg | INTRAMUSCULAR | Status: DC | PRN

## 2016-04-26 MED ORDER — TERBUTALINE SULFATE 1 MG/ML IJ SOLN
0.2500 mg | Freq: Once | INTRAMUSCULAR | Status: DC | PRN
  Filled 2016-04-26: qty 1

## 2016-04-26 MED ORDER — ONDANSETRON HCL 4 MG/2ML IJ SOLN: 4.0000 mg | Freq: Four times a day (QID) | INTRAMUSCULAR | Status: DC | PRN

## 2016-04-26 MED ORDER — ACETAMINOPHEN 325 MG PO TABS: 650.0000 mg | ORAL_TABLET | ORAL | Status: DC | PRN

## 2016-04-26 MED ORDER — EPHEDRINE 5 MG/ML INJ: 10.0000 mg | INTRAVENOUS | Status: DC | PRN

## 2016-04-26 MED ORDER — PHENYLEPHRINE 40 MCG/ML (10ML) SYRINGE FOR IV PUSH (FOR BLOOD PRESSURE SUPPORT): 80.0000 ug | PREFILLED_SYRINGE | INTRAVENOUS | Status: DC | PRN

## 2016-04-26 MED ORDER — OXYTOCIN 40 UNITS IN LACTATED RINGERS INFUSION - SIMPLE MED
1.0000 m[IU]/min | INTRAVENOUS | Status: DC
  Administered 2016-04-26: 2 m[IU]/min via INTRAVENOUS
  Filled 2016-04-26: qty 1000

## 2016-04-26 MED ORDER — LACTATED RINGERS IV SOLN: 500.0000 mL | INTRAVENOUS | Status: DC | PRN

## 2016-04-26 MED ORDER — OXYTOCIN BOLUS FROM INFUSION
500.0000 mL | Freq: Once | INTRAVENOUS | Status: AC
  Administered 2016-04-27: 500 mL via INTRAVENOUS

## 2016-04-26 MED ORDER — LACTATED RINGERS IV SOLN: 500.0000 mL | Freq: Once | INTRAVENOUS | Status: DC

## 2016-04-26 MED ORDER — LACTATED RINGERS IV SOLN
INTRAVENOUS | Status: DC
  Administered 2016-04-26 (×2): via INTRAVENOUS

## 2016-04-26 MED ORDER — PHENYLEPHRINE 40 MCG/ML (10ML) SYRINGE FOR IV PUSH (FOR BLOOD PRESSURE SUPPORT)
80.0000 ug | PREFILLED_SYRINGE | INTRAVENOUS | Status: DC | PRN
  Filled 2016-04-26: qty 5
  Filled 2016-04-26: qty 10

## 2016-04-26 MED ORDER — OXYCODONE-ACETAMINOPHEN 5-325 MG PO TABS: 1.0000 | ORAL_TABLET | ORAL | Status: DC | PRN

## 2016-04-26 MED ORDER — EPHEDRINE 5 MG/ML INJ
10.0000 mg | INTRAVENOUS | Status: DC | PRN
  Filled 2016-04-26: qty 2

## 2016-04-26 MED ORDER — SOD CITRATE-CITRIC ACID 500-334 MG/5ML PO SOLN: 30.0000 mL | ORAL | Status: DC | PRN

## 2016-04-26 MED ORDER — PHENYLEPHRINE 40 MCG/ML (10ML) SYRINGE FOR IV PUSH (FOR BLOOD PRESSURE SUPPORT)
80.0000 ug | PREFILLED_SYRINGE | INTRAVENOUS | Status: DC | PRN
  Filled 2016-04-26: qty 10
  Filled 2016-04-26: qty 5

## 2016-04-26 MED ORDER — OXYCODONE-ACETAMINOPHEN 5-325 MG PO TABS: 2.0000 | ORAL_TABLET | ORAL | Status: DC | PRN

## 2016-04-26 MED ORDER — LIDOCAINE HCL (PF) 1 % IJ SOLN
INTRAMUSCULAR | Status: DC | PRN
  Administered 2016-04-26: 2 mL
  Administered 2016-04-26: 5 mL
  Administered 2016-04-26: 3 mL

## 2016-04-26 MED ORDER — FENTANYL CITRATE (PF) 100 MCG/2ML IJ SOLN
100.0000 ug | INTRAMUSCULAR | Status: DC | PRN
  Administered 2016-04-26: 100 ug via INTRAVENOUS
  Filled 2016-04-26: qty 2

## 2016-04-26 MED ORDER — FENTANYL 2.5 MCG/ML BUPIVACAINE 1/10 % EPIDURAL INFUSION (WH - ANES)
14.0000 mL/h | INTRAMUSCULAR | Status: DC | PRN
  Administered 2016-04-26: 14 mL/h via EPIDURAL
  Filled 2016-04-26: qty 100

## 2016-04-26 MED ORDER — LIDOCAINE HCL (PF) 1 % IJ SOLN
30.0000 mL | INTRAMUSCULAR | Status: DC | PRN
  Filled 2016-04-26: qty 30

## 2016-04-26 NOTE — H&P (Signed)
OBSTETRIC ADMISSION HISTORY AND PHYSICAL  Kelli Russell is a 31 y.o. female [phone] with IUP at [redacted]w[redacted]d by LMP confirmed with 20 week [place] presenting for post dates. She has elevated BPs in MAU and has a history of pre-eclampsia. She has mild headache, some peripheral edema, no RUQ pain, and gets spots in vision with position change. Also c/o toothache. She reports +FMs, No LOF, no VB.  She plans on breast feeding. She is undecided about birth control.  Dating: By LMP c/w 20 week [place] --->  Estimated Date of Delivery: 04/25/16  Prenatal History/Complications:  Past Medical History: Past Medical History:  Diagnosis Date  . Female circumcision   . Pregnancy induced hypertension     Past Surgical History: Past Surgical History:  Procedure Laterality Date  . CHOLECYSTECTOMY      Obstetrical History: OB History    Gravida Para Term Preterm AB Living   0 2 2   SAB TAB Ectopic Multiple Live Births   2 0 0 0 2      Social History: Social History   Social History  . Marital status: Married    Spouse name: N/A  . Number of children: N/A  . Years of education: N/A   Social History Main Topics  . Smoking status: Never Smoker  . Smokeless tobacco: Never Used  . Alcohol use No  . Drug use: No  . Sexual activity: Yes   Other Topics Concern  . None   Social History Narrative   ** Merged History Encounter **        Family History: History reviewed. No pertinent family history.  Allergies: No Known Allergies  Prescriptions Prior to Admission  Medication Sig Dispense Refill Last Dose  . aspirin 81 MG chewable tablet Chew 81 mg by mouth daily.   04/25/2016 at Unknown time  . Prenatal Multivit-Min-Fe-FA (PRENATAL VITAMINS) 0.8 MG tablet Take 1 tablet by mouth daily. 30 tablet 7 Past Week at Unknown time     Review of Systems   All systems reviewed and negative except as stated in HPI  Blood pressure (!) 142/89, pulse (!) 111, temperature 98.3 F (36.8 C),  temperature source Oral, resp. rate 20, weight (!) 148.9 kg (328 lb 4 oz), last menstrual period 07/12/2015, SpO2 100 %, currently breastfeeding. General appearance: alert, cooperative and no distress Lungs: clear to auscultation bilaterally Heart: regular rate and rhythm Abdomen: soft, non-tender; bowel sounds normal Extremities: Homans sign is negative, no sign of DVT Presentation: cephalic confirmed with US Fetal monitoringBaseline: 145 bpm Uterine activity: Occasional Dilation: 2 Effacement (%): 70 Station: -3 Exam by:: Mumaw   Prenatal labs: ABO, Rh:   O+ Antibody:  negative Rubella: Immune RPR:   Negative HBsAg:   Negative HIV:    nonreactive GBS:   Negative 1 hr Glucola 148; passed 3 hour Genetic screening  MSAFP wnl Anatomy US: suboptimal view of LVOT, otherwise wnl  Prenatal Transfer Tool  Maternal Diabetes: No Genetic Screening: Normal Maternal Ultrasounds/Referrals: Normal Fetal Ultrasounds or other Referrals:  None Maternal Substance Abuse:  No Significant Maternal Medications:  None Significant Maternal Lab Results: Lab values include: Group B Strep negative, Other: +ppd, negative cxr  Results for orders placed or performed during the hospital encounter of 04/26/16 (from the past 24 hour(s))  Protein / creatinine ratio, urine   Collection Time: 04/26/16  1:45 PM  Result Value Ref Range   Creatinine, Urine 43.00 mg/dL   Total Protein, Urine 8 mg/dL  Protein Creatinine Ratio 0.19 (H) 0.00 - 0.15 mg/mg[Cre]  Comprehensive metabolic panel   Collection Time: 04/26/16  2:25 PM  Result Value Ref Range   Sodium 135 135 - 145 mmol/L   Potassium 4.2 3.5 - 5.1 mmol/L   Chloride 108 101 - 111 mmol/L   CO2 20 (L) 22 - 32 mmol/L   Glucose, Bld 103 (H) 65 - 99 mg/dL   BUN PENDING 6 - 20 mg/dL   Creatinine, Ser 1.61 (L) 0.44 - 1.00 mg/dL   Calcium 8.9 8.9 - 09.6 mg/dL   Total Protein 6.1 (L) 6.5 - 8.1 g/dL   Albumin 2.5 (L) 3.5 - 5.0 g/dL   AST 17 15 - 41 U/L    ALT 9 (L) 14 - 54 U/L   Alkaline Phosphatase 128 (H) 38 - 126 U/L   Total Bilirubin 0.5 0.3 - 1.2 mg/dL   GFR calc non Af Amer >60 >60 mL/min   GFR calc Af Amer >60 >60 mL/min   Anion gap 7 5 - 15    Patient Active Problem List   Diagnosis Date Noted  . Language barrier 05/30/2014  . Sciatic nerve pain 05/09/2014    Assessment: Kelli Russell is a 31 y.o. [id number] at [redacted]w[redacted]d here for labor evaluation found to have elevated blood pressures. Will admit for labor induction.  #Labor: Labor induction with foley bulb and will start pitocin once foley bulb out.  #Pain: Epidural upon maternal request and >3cm #FWB: Category 1 tracing #ID:  GBS negative #MOF: breast #MOC: undecided  gHTN -h/o preE in previous pregnancy -negative preE labs -admit for induction  Durenda Hurt, MD 04/26/2016, 2:34 PM   OB FELLOW HISTORY AND PHYSICAL ATTESTATION  I have seen and examined this patient; I agree with above documentation in the resident's note.    Jen Mow, DO [place] Fellow 04/26/2016

## 2016-04-26 NOTE — Progress Notes (Signed)
   Kelli Russell is a 31 y.o. [id number] at [redacted]w[redacted]d  admitted for induction of labor due to gHTN diagnosed today.  Subjective:  Patient resting comfortably in bed, does not feel her contractions.  Objective: Vitals:   04/26/16 2045 04/26/16 2131 04/26/16 2233 04/26/16 2251  BP: 133/70 (!) 142/100 (!) 143/85   Pulse: (!) 118 (!) 113 (!) 114   Resp: 18 18    Temp:    98.6 F (37 C)  TempSrc:      SpO2:      Weight:      Height:       No intake/output data recorded.  FHT:  FHR: 125 bpm, variability: moderate,  accelerations:  Present,  decelerations:  Absent UC:   irregular, every 3-5 minutes SVE:   Dilation: 5 Effacement (%): 70, 80 Station: -2 Exam by:: Sherrell Puller Pitocin @ 71mu/min  Labs: Lab Results  Component Value Date   WBC 10.7 (H) 04/26/2016   HGB 13.7 04/26/2016   HCT 40.3 04/26/2016   MCV 88.8 04/26/2016   PLT 310 04/26/2016    Assessment / Plan: Induction of labor due to gestational hypertension,  progressing well on pitocin AROM at 2248; clear. BP stable; no severe range pressures.  Labor: Progressing normally Fetal Wellbeing:  Category I Pain Control:  Epidural Anticipated MOD:  NSVD  Kelli Russell 04/26/2016, 10:54 PM

## 2016-04-26 NOTE — MAU Note (Addendum)
Past her due date. Has swollen feet and legs- increasing the past wk.  Having a lot of vag pain and pressure.  Increased heart rate and a toothache, headache- started on Sat. Called her office, was told to come here.  Wants to be induced. Seeing flashes. ? Pain in RUQ.

## 2016-04-26 NOTE — Anesthesia Preprocedure Evaluation (Signed)
Anesthesia Evaluation  Patient identified by MRN, date of birth, ID band Patient awake    Reviewed: Allergy & Precautions, Patient's Chart, lab work & pertinent test results  Airway Mallampati: II       Dental   Pulmonary neg pulmonary ROS,    Pulmonary exam normal        Cardiovascular hypertension, Normal cardiovascular exam     Neuro/Psych negative neurological ROS     GI/Hepatic negative GI ROS, Neg liver ROS,   Endo/Other  Morbid obesity  Renal/GU negative Renal ROS     Musculoskeletal   Abdominal   Peds  Hematology negative hematology ROS (+)   Anesthesia Other Findings   Reproductive/Obstetrics (+) Pregnancy (IOL 2/2 GHTN)                             Lab Results  Component Value Date   WBC 10.7 (H) 04/26/2016   HGB 13.7 04/26/2016   HCT 40.3 04/26/2016   MCV 88.8 04/26/2016   PLT 310 04/26/2016    Anesthesia Physical Anesthesia Plan  ASA: III  Anesthesia Plan: Epidural   Post-op Pain Management:    Induction:   Airway Management Planned: Natural Airway  Additional Equipment:   Intra-op Plan:   Post-operative Plan:   Informed Consent: I have reviewed the patients History and Physical, chart, labs and discussed the procedure including the risks, benefits and alternatives for the proposed anesthesia with the patient or authorized representative who has indicated his/her understanding and acceptance.     Plan Discussed with:   Anesthesia Plan Comments:         Anesthesia Quick Evaluation

## 2016-04-26 NOTE — Progress Notes (Signed)
Labor Progress Note  S: patient resting comfortably. Discussed foley bulb placement via interpreter services and patient agreeable  O:  BP 135/84   Pulse (!) 110   Temp 98.6 F (37 C) (Oral)   Resp 18   Wt (!) 148.9 kg (328 lb 4 oz)   LMP 07/12/2015   SpO2 99%   BMI 49.91 kg/m  EFM: 140, mod var, +accels, no decels BJY:NWGNFAOZ: 2 Effacement (%): 70 Station: -3 Presentation: Vertex Exam by:: dr. Hillis Range   A&P: 31 y.o. [id number] [redacted]w[redacted]d IOL for development of gHTN at term #Labor:foley bulb with pitocin (not to exceed 6 units of pitocin) #FWB: Category 1 tracing #GBS: negative # gHTN: monitor BPs, not severe range. No meds indicated at this time. preE labs negative  Durenda Hurt, MD Resident Physician 4:46 PM

## 2016-04-26 NOTE — Anesthesia Procedure Notes (Signed)
Epidural Patient location during procedure: OB Start time: 04/26/2016 8:10 PM End time: 04/26/2016 8:23 PM  Staffing Anesthesiologist: Marcene Duos Performed: anesthesiologist   Preanesthetic Checklist Completed: patient identified, site marked, surgical consent, pre-op evaluation, timeout performed, IV checked, risks and benefits discussed and monitors and equipment checked  Epidural Patient position: sitting Prep: site prepped and draped and DuraPrep Patient monitoring: continuous pulse ox and blood pressure Approach: midline Location: L4-L5 Injection technique: LOR air  Needle:  Needle type: Tuohy  Needle gauge: 17 G Needle length: 9 cm and 9 Needle insertion depth: 8 cm Catheter type: closed end flexible Catheter size: 19 Gauge Catheter at skin depth: 16 cm Test dose: negative  Assessment Events: blood not aspirated, injection not painful, no injection resistance, negative IV test and no paresthesia

## 2016-04-27 ENCOUNTER — Encounter (HOSPITAL_COMMUNITY): Payer: Self-pay

## 2016-04-27 DIAGNOSIS — Z3A40 40 weeks gestation of pregnancy: Secondary | ICD-10-CM

## 2016-04-27 DIAGNOSIS — O133 Gestational [pregnancy-induced] hypertension without significant proteinuria, third trimester: Secondary | ICD-10-CM

## 2016-04-27 DIAGNOSIS — O480 Post-term pregnancy: Secondary | ICD-10-CM

## 2016-04-27 LAB — RPR: RPR: NONREACTIVE

## 2016-04-27 MED ORDER — COCONUT OIL OIL: 1.0000 "application " | TOPICAL_OIL | Status: DC | PRN

## 2016-04-27 MED ORDER — DIBUCAINE 1 % RE OINT: 1.0000 "application " | TOPICAL_OINTMENT | RECTAL | Status: DC | PRN

## 2016-04-27 MED ORDER — DIPHENHYDRAMINE HCL 25 MG PO CAPS: 25.0000 mg | ORAL_CAPSULE | Freq: Four times a day (QID) | ORAL | Status: DC | PRN

## 2016-04-27 MED ORDER — ACETAMINOPHEN 325 MG PO TABS
650.0000 mg | ORAL_TABLET | ORAL | Status: DC | PRN
  Administered 2016-04-27: 650 mg via ORAL
  Filled 2016-04-27: qty 2

## 2016-04-27 MED ORDER — PRENATAL MULTIVITAMIN CH
1.0000 | ORAL_TABLET | Freq: Every day | ORAL | Status: DC
  Administered 2016-04-27 – 2016-04-28 (×2): 1 via ORAL
  Filled 2016-04-27 (×2): qty 1

## 2016-04-27 MED ORDER — ONDANSETRON HCL 4 MG/2ML IJ SOLN: 4.0000 mg | INTRAMUSCULAR | Status: DC | PRN

## 2016-04-27 MED ORDER — IBUPROFEN 600 MG PO TABS
600.0000 mg | ORAL_TABLET | Freq: Four times a day (QID) | ORAL | Status: DC
  Administered 2016-04-27 – 2016-04-28 (×6): 600 mg via ORAL
  Filled 2016-04-27 (×6): qty 1

## 2016-04-27 MED ORDER — ONDANSETRON HCL 4 MG PO TABS: 4.0000 mg | ORAL_TABLET | ORAL | Status: DC | PRN

## 2016-04-27 MED ORDER — BENZOCAINE-MENTHOL 20-0.5 % EX AERO: 1.0000 "application " | INHALATION_SPRAY | CUTANEOUS | Status: DC | PRN

## 2016-04-27 MED ORDER — SIMETHICONE 80 MG PO CHEW: 80.0000 mg | CHEWABLE_TABLET | ORAL | Status: DC | PRN

## 2016-04-27 MED ORDER — TETANUS-DIPHTH-ACELL PERTUSSIS 5-2.5-18.5 LF-MCG/0.5 IM SUSP: 0.5000 mL | Freq: Once | INTRAMUSCULAR | Status: DC

## 2016-04-27 MED ORDER — WITCH HAZEL-GLYCERIN EX PADS: 1.0000 "application " | MEDICATED_PAD | CUTANEOUS | Status: DC | PRN

## 2016-04-27 MED ORDER — SENNOSIDES-DOCUSATE SODIUM 8.6-50 MG PO TABS
2.0000 | ORAL_TABLET | ORAL | Status: DC
  Administered 2016-04-27: 2 via ORAL
  Filled 2016-04-27: qty 2

## 2016-04-27 MED ORDER — ZOLPIDEM TARTRATE 5 MG PO TABS: 5.0000 mg | ORAL_TABLET | Freq: Every evening | ORAL | Status: DC | PRN

## 2016-04-27 NOTE — Progress Notes (Signed)
Using somali interpreter to communicate.  Husband in room  Speaks very little [place]

## 2016-04-27 NOTE — Progress Notes (Signed)
Interpreter called to talk with mom concerning her care and concerns.  Dinner order confirmed with mom Glade Lloyd [phone] intrepreter for somolei

## 2016-04-27 NOTE — Anesthesia Postprocedure Evaluation (Signed)
Anesthesia Post Note  Patient: Kelli Russell  Procedure(s) Performed: * No procedures listed *  Patient location during evaluation: Mother Baby Anesthesia Type: Epidural Level of consciousness: awake Pain management: pain level controlled Vital Signs Assessment: post-procedure vital signs reviewed and stable Respiratory status: spontaneous breathing Cardiovascular status: stable Postop Assessment: no headache, no backache, epidural receding and patient able to bend at knees Anesthetic complications: no        Last Vitals:  Vitals:   04/27/16 0500 04/27/16 0550  BP: (!) 143/87 112/63  Pulse: (!) 105 92  Resp: 18 18  Temp: 37.1 C 37 C    Last Pain:  Vitals:   04/27/16 0550  TempSrc: Oral  PainSc: 0-No pain   Pain Goal:                 Edison Pace

## 2016-04-27 NOTE — Lactation Note (Addendum)
This note was copied from a baby's chart. Lactation Consultation Note  Patient Name: Kelli Russell Today's Date: 04/27/2016 Reason for consult: Initial assessment  Initial visit at 16 hours of age, using video interpreter Glade Lloyd [phone] speaking somoli.  Mom denies pain or concerns with breastfeed.  Mom wanted to give baby formula because she thought baby was still hungry.  LC discussed delay of formula use as she did with her older children.  Baby asleep in crib.  Mom reports attempts to wake baby recently.  [facility] LC resources given and discussed.  Encouraged to feed with early cues on demand.  Baby should feed 8-12x/day.  Early newborn behavior discussed.  Hand expression reported by mom with colostrum visible.  Mom to call for assist as needed.     Maternal Data Has patient been taught Hand Expression?: Yes Does the patient have breastfeeding experience prior to this delivery?: Yes  Feeding    LATCH Score/Interventions                Intervention(s): Breastfeeding basics reviewed     Lactation Tools Discussed/Used WIC Program: Yes   Consult Status Consult Status: PRN    Jannifer Rodney 04/27/2016, 6:19 PM

## 2016-04-28 NOTE — Discharge Instructions (Signed)

## 2016-04-28 NOTE — Discharge Summary (Signed)
OB Discharge Summary     Patient Name: Kelli Russell DOB: 04-11-1985 MRN: [id number]  Date of admission: 04/26/2016 Delivering MD: Lovena Neighbours   Date of discharge: 04/28/2016  Admitting diagnosis: 41WKS CONTRACTIONS Intrauterine pregnancy: [redacted]w[redacted]d     Secondary diagnosis:  Active Problems:   Normal labor  Additional problems: GHTN     Discharge diagnosis: Term Pregnancy Delivered and Gestational Hypertension                                                                                                Post partum procedures:None   Augmentation: Pitocin and Foley Balloon  Complications: None  Hospital course:  Onset of Labor With Vaginal Delivery     31 y.o. yo [id number] at [redacted]w[redacted]d was admitted in Active Labor on 04/26/2016. Patient had an uncomplicated labor course as follows:  Membrane Rupture Time/Date: 10:43 PM ,04/26/2016   Intrapartum Procedures: Episiotomy: None [1]                                         Lacerations:  2nd degree [3]  Patient had a delivery of a Viable infant. 04/27/2016  Information for the patient's newborn:  Diriye Russell, Girl Arielis [[id number]]  Delivery Method: Vag-Spont    Pateint had an uncomplicated postpartum course.  She is ambulating, tolerating a regular diet, passing flatus, and urinating well. Patient is discharged home in stable condition on 04/28/16.   Physical exam  Vitals:   04/27/16 0550 04/27/16 1200 04/27/16 1910 04/28/16 0500  BP: 112/63 (!) 148/93 131/82 131/62  Pulse: 92 97 (!) 102 99  Resp: Temp: 98.6 F (37 C) 98 F (36.7 C) 98.6 F (37 C) 98 F (36.7 C)  TempSrc: Oral Oral Oral Oral  SpO2:      Weight:      Height:       General: alert, cooperative and no distress Lochia: appropriate Uterine Fundus: firm  DVT Evaluation: No evidence of DVT seen on physical exam. Labs: Lab Results  Component Value Date   WBC 10.7 (H) 04/26/2016   HGB 13.7 04/26/2016   HCT 40.3 04/26/2016   MCV 88.8  04/26/2016   PLT 310 04/26/2016   CMP Latest Ref Rng & Units 04/26/2016  Glucose 65 - 99 mg/dL 132(G)  BUN 6 - 20 mg/dL <4(W)  Creatinine 1.02 - 1.00 mg/dL 7.25(D)  Sodium 664 - 403 mmol/L 135  Potassium 3.5 - 5.1 mmol/L 4.2  Chloride 101 - 111 mmol/L 108  CO2 22 - 32 mmol/L 20(L)  Calcium 8.9 - 10.3 mg/dL 8.9  Total Protein 6.5 - 8.1 g/dL 6.1(L)  Total Bilirubin 0.3 - 1.2 mg/dL 0.5  Alkaline Phos 38 - 126 U/L 128(H)  AST 15 - 41 U/L 17  ALT 14 - 54 U/L 9(L)    Discharge instruction: per After Visit Summary and "Baby and Me Booklet".  After visit meds:  Allergies as of 04/28/2016   No Known Allergies  Medication List    STOP taking these medications   Prenatal Vitamins 0.8 MG tablet     TAKE these medications   acetaminophen 325 MG tablet Commonly known as:  TYLENOL Take 650 mg by mouth every 6 (six) hours as needed for mild pain or headache.   aspirin 81 MG chewable tablet Chew 81 mg by mouth daily.       Diet: routine diet  Activity: Advance as tolerated. Pelvic rest for 6 weeks.   Outpatient follow up:6 weeks Follow up Appt:No future appointments. Follow up Visit:Patient will call to schedule postpartum visit  Postpartum contraception: Nexplanon  Newborn Data: Live born female  Birth Weight: 8 lb 6.6 oz (3816 g) APGAR: 8, 9  Baby Feeding: Breast Disposition:home with mother   04/28/2016 Lovena Neighbours, MD  I confirm that I have verified the information documented in the resident's note and that I have also personally performed the physical exam and all medical decision making activities.  Luna Kitchens CNM
